# Patient Record
Sex: Female | Born: 1960 | Race: White | Hispanic: No | Marital: Married | Smoking: Never smoker
Health system: Southern US, Community
[De-identification: ages and names within clinical notes are randomized; demographics above are authoritative.]

## PROBLEM LIST (undated history)

## (undated) DIAGNOSIS — E119 Type 2 diabetes mellitus without complications: Secondary | ICD-10-CM

## (undated) DIAGNOSIS — I10 Essential (primary) hypertension: Secondary | ICD-10-CM

## (undated) DIAGNOSIS — K5792 Diverticulitis of intestine, part unspecified, without perforation or abscess without bleeding: Secondary | ICD-10-CM

## (undated) HISTORY — PX: COLON SURGERY: SHX602

## (undated) HISTORY — DX: Type 2 diabetes mellitus without complications: E11.9

---

## 1998-05-01 ENCOUNTER — Encounter: Admission: RE | Admit: 1998-05-01 | Discharge: 1998-07-30 | Payer: Self-pay | Admitting: Family Medicine

## 1999-01-15 ENCOUNTER — Other Ambulatory Visit: Admission: RE | Admit: 1999-01-15 | Discharge: 1999-01-15 | Payer: Self-pay | Admitting: Obstetrics and Gynecology

## 2000-08-09 ENCOUNTER — Other Ambulatory Visit: Admission: RE | Admit: 2000-08-09 | Discharge: 2000-08-09 | Payer: Self-pay | Admitting: Obstetrics and Gynecology

## 2000-12-26 ENCOUNTER — Encounter (INDEPENDENT_AMBULATORY_CARE_PROVIDER_SITE_OTHER): Payer: Self-pay | Admitting: Specialist

## 2000-12-26 ENCOUNTER — Ambulatory Visit (HOSPITAL_COMMUNITY): Admission: RE | Admit: 2000-12-26 | Discharge: 2000-12-26 | Payer: Self-pay | Admitting: Obstetrics and Gynecology

## 2003-03-13 ENCOUNTER — Other Ambulatory Visit: Admission: RE | Admit: 2003-03-13 | Discharge: 2003-03-13 | Payer: Self-pay | Admitting: Obstetrics and Gynecology

## 2004-05-01 ENCOUNTER — Ambulatory Visit: Payer: Self-pay | Admitting: Internal Medicine

## 2004-06-15 ENCOUNTER — Other Ambulatory Visit: Admission: RE | Admit: 2004-06-15 | Discharge: 2004-06-15 | Payer: Self-pay | Admitting: Obstetrics and Gynecology

## 2004-07-06 ENCOUNTER — Ambulatory Visit: Payer: Self-pay | Admitting: Internal Medicine

## 2004-09-16 ENCOUNTER — Ambulatory Visit: Payer: Self-pay | Admitting: Internal Medicine

## 2004-10-23 ENCOUNTER — Ambulatory Visit: Payer: Self-pay | Admitting: Internal Medicine

## 2004-11-19 ENCOUNTER — Ambulatory Visit: Payer: Self-pay | Admitting: Internal Medicine

## 2004-11-30 ENCOUNTER — Ambulatory Visit: Payer: Self-pay | Admitting: Internal Medicine

## 2005-10-15 ENCOUNTER — Ambulatory Visit: Payer: Self-pay | Admitting: Internal Medicine

## 2005-12-15 ENCOUNTER — Ambulatory Visit: Payer: Self-pay | Admitting: Internal Medicine

## 2006-05-30 ENCOUNTER — Ambulatory Visit: Payer: Self-pay | Admitting: Internal Medicine

## 2006-10-24 IMAGING — CT CT ABD-PELV W/O CM
3 of 8 series · 12 of 42 positions shown, 18 images · IV contrast (CONTRAST)
Comparison: NONE

CLINICAL DATA: Left lower quadrant pain and fever, evaluate for 
diverticulitis.  

CT OF THE ABDOMEN AND PELVIS WITHOUT AND WITH INTRAVENOUS AND 
FOLLOWING  ORAL CONTRAST
TECHNIQUE: Multiple axial 5 millimeter thick slices at 5 
millimeter intervals were obtained from the lung base through the 
pelvis following the intravenous administration of 100 cc of 
Optiray 350 at a rate of 3 cc per second.  Oral contrast was 
administered as well.  Arterial and venous phase imaging was 
obtained in the upper abdomen with delayed images obtained through 
the pelvis.

[Series 4: venous · axial · portal-venous · 0.68mm/px · z∈[+875,+1190]mm · 5 of 95 slices shown, 10 images]
[im 16/95  soft-tissue]
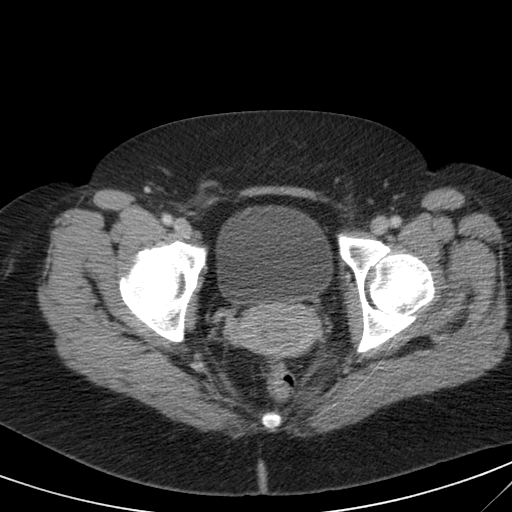
[im 16/95  bone]
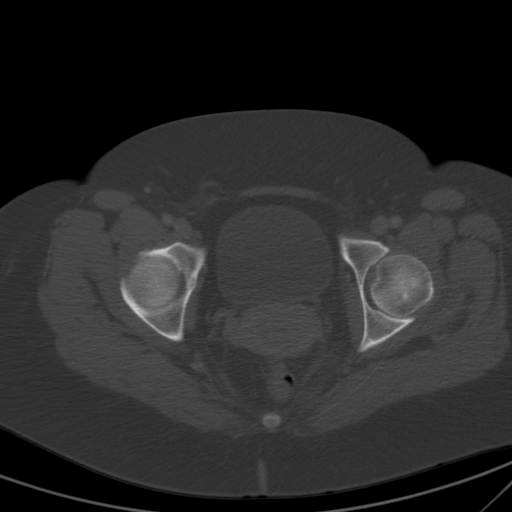
[im 32/95  soft-tissue]
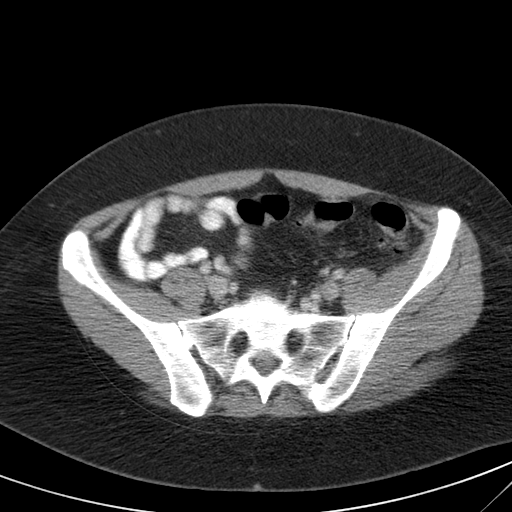
[im 32/95  lung]
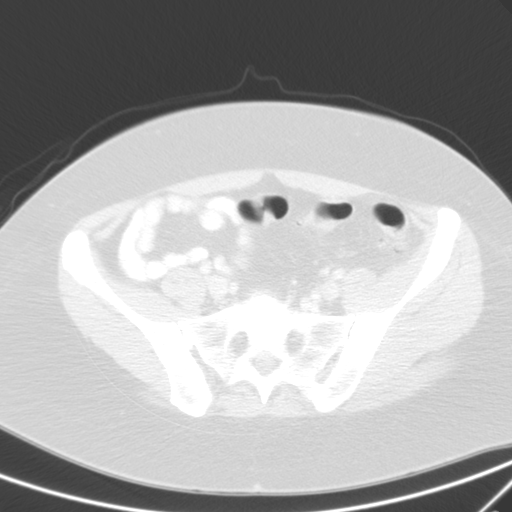
[im 48/95  soft-tissue]
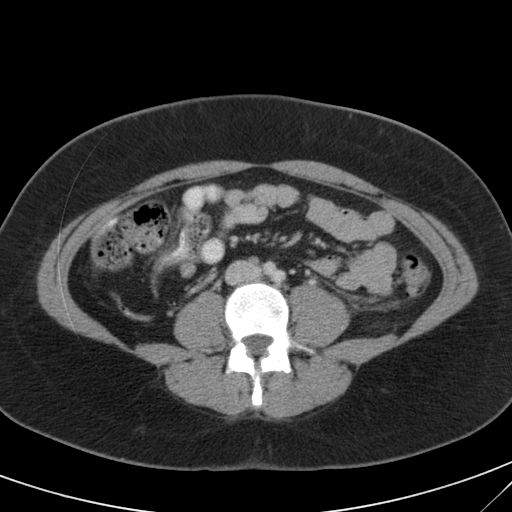
[im 48/95  lung]
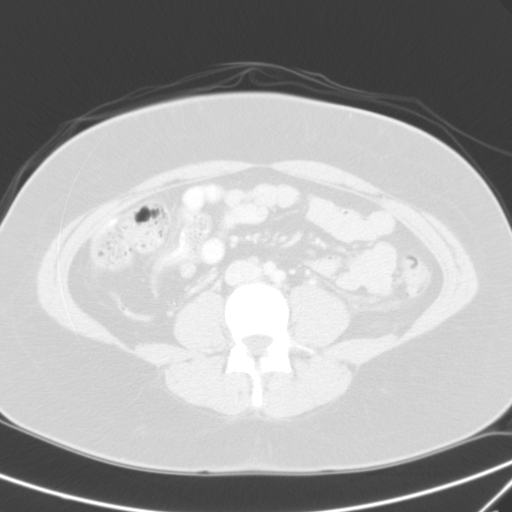
[im 63/95  soft-tissue]
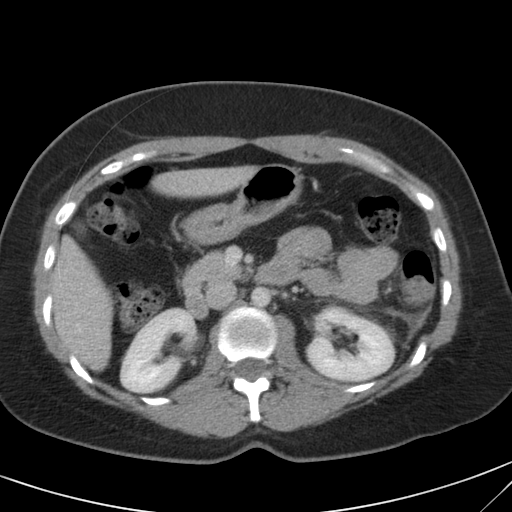
[im 63/95  lung]
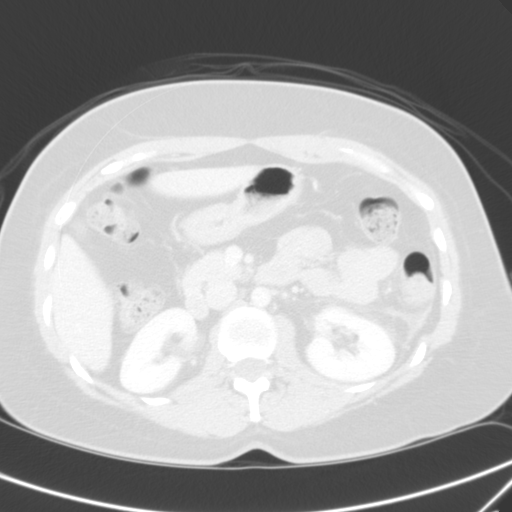
[im 79/95  soft-tissue]
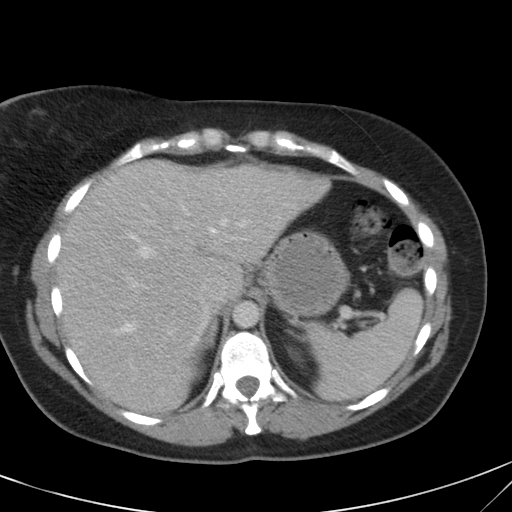
[im 79/95  lung]
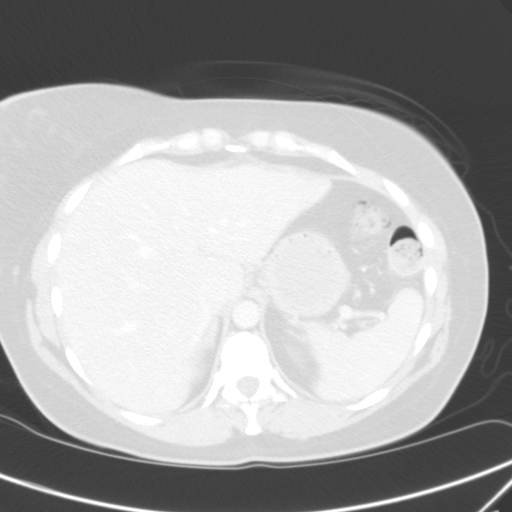

[Series 9: delays · axial · 0.68mm/px · z∈[+925,+1150]mm · 4 of 76 slices shown]
[im 16/76  soft-tissue]
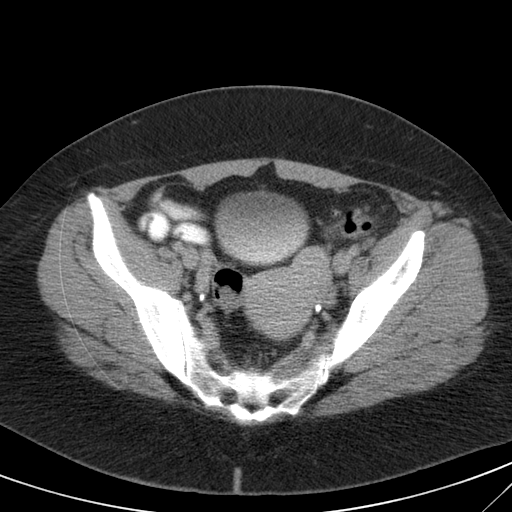
[im 31/76  soft-tissue]
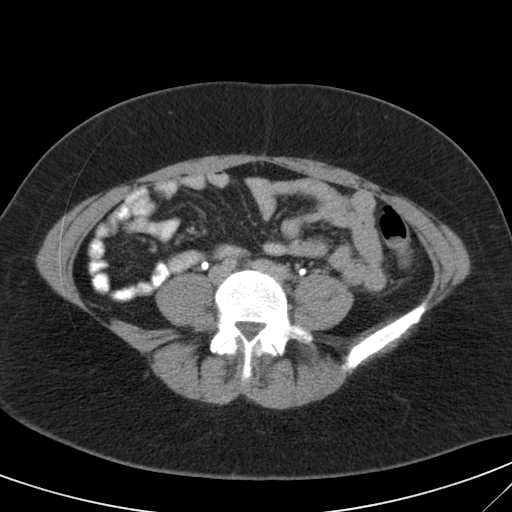
[im 46/76  soft-tissue]
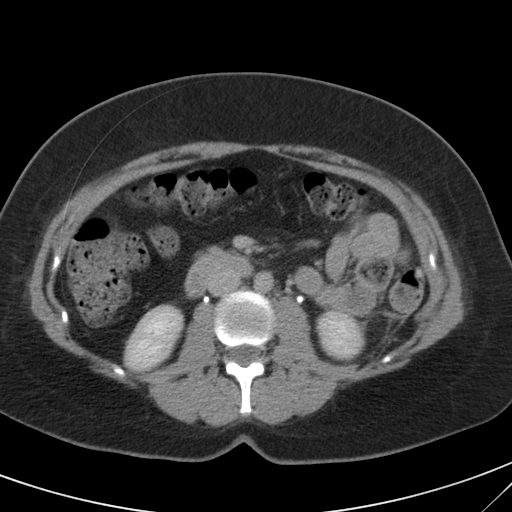
[im 61/76  soft-tissue]
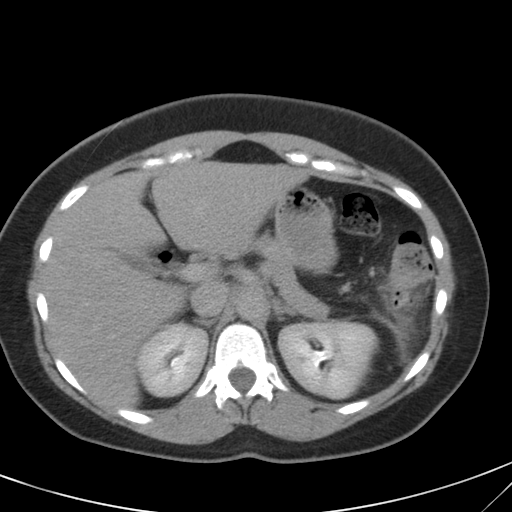

[coronals · coronal · 0.92mm/px · 3 of 68 slices shown, 4 images]
[im 23/68  soft-tissue]
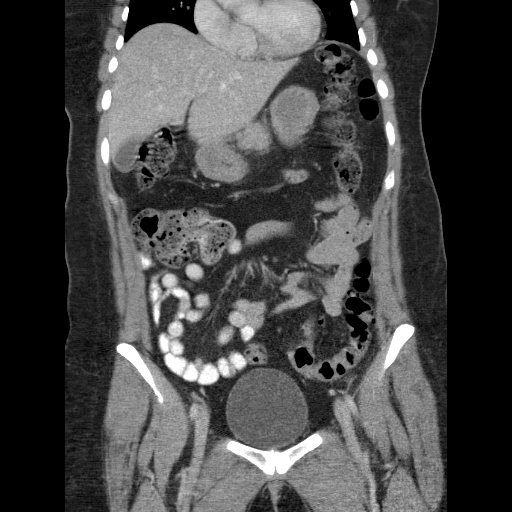
[im 30/68  soft-tissue]
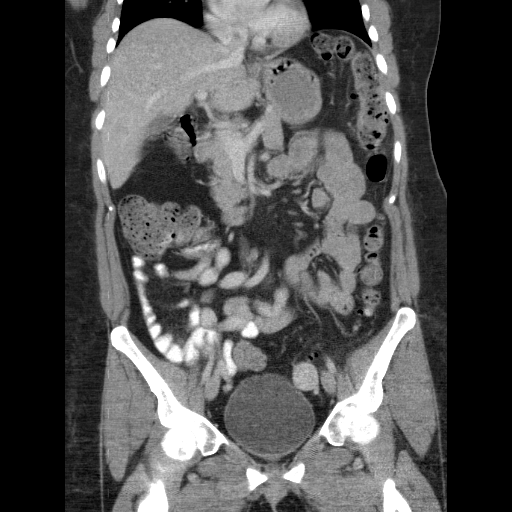
[im 30/68  bone]
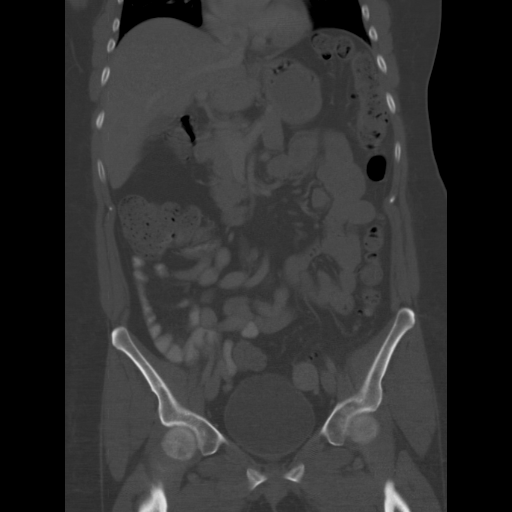
[im 38/68  soft-tissue]
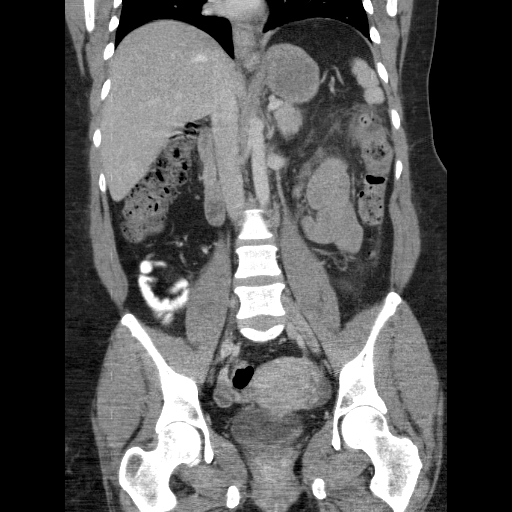

[12 of 42 positions shown; findings below may reference images not displayed]

FINDINGS: The heart size appears to be upper limits of normal.  
The visualized portion of the lung bases are within normal limits. 
 The liver, spleen, pancreas, adrenal glands, and gallbladder are 
within normal limits.  Kidneys are within normal limits.  Uterus 
and ovaries are grossly within normal limits. There is evidence of 
diverticulosis involving the sigmoid colon.  There are also 
diverticula seen in the descending colon.  There is inflammation 
adjacent to the proximal descending colon.  No evidence of abscess 
collection or free air.  There is paracolic stranding seen in this 
region.  No free fluid.  No evidence of lymphadenopathy.  No 
aortic aneurysm or anterior abdominal wall hernia.  No evidence of 
inguinal hernia.
IMPRESSION: Evidence of diverticulitis involving the proximal 
descending colon.  No evidence of abscess collection or free air.

## 2006-11-04 ENCOUNTER — Encounter: Admission: RE | Admit: 2006-11-04 | Discharge: 2006-11-04 | Payer: Self-pay | Admitting: Gastroenterology

## 2006-11-04 ENCOUNTER — Inpatient Hospital Stay (HOSPITAL_COMMUNITY): Admission: AD | Admit: 2006-11-04 | Discharge: 2006-11-07 | Payer: Self-pay | Admitting: Gastroenterology

## 2007-02-13 ENCOUNTER — Ambulatory Visit: Payer: Self-pay | Admitting: Internal Medicine

## 2007-03-24 ENCOUNTER — Encounter: Payer: Self-pay | Admitting: Internal Medicine

## 2007-04-13 ENCOUNTER — Inpatient Hospital Stay (HOSPITAL_COMMUNITY): Admission: RE | Admit: 2007-04-13 | Discharge: 2007-04-18 | Payer: Self-pay | Admitting: Surgery

## 2007-04-13 ENCOUNTER — Encounter (INDEPENDENT_AMBULATORY_CARE_PROVIDER_SITE_OTHER): Payer: Self-pay | Admitting: Surgery

## 2007-05-09 ENCOUNTER — Encounter: Payer: Self-pay | Admitting: Internal Medicine

## 2007-07-20 DIAGNOSIS — Z8719 Personal history of other diseases of the digestive system: Secondary | ICD-10-CM | POA: Insufficient documentation

## 2007-07-20 DIAGNOSIS — J310 Chronic rhinitis: Secondary | ICD-10-CM

## 2007-08-22 ENCOUNTER — Encounter: Payer: Self-pay | Admitting: Internal Medicine

## 2007-08-28 ENCOUNTER — Encounter: Admission: RE | Admit: 2007-08-28 | Discharge: 2007-08-28 | Payer: Self-pay | Admitting: Gastroenterology

## 2007-08-28 ENCOUNTER — Encounter: Payer: Self-pay | Admitting: Internal Medicine

## 2007-10-25 ENCOUNTER — Telehealth: Payer: Self-pay | Admitting: Internal Medicine

## 2008-02-21 ENCOUNTER — Ambulatory Visit: Payer: Self-pay | Admitting: Internal Medicine

## 2008-02-21 DIAGNOSIS — F411 Generalized anxiety disorder: Secondary | ICD-10-CM | POA: Insufficient documentation

## 2008-02-21 DIAGNOSIS — I10 Essential (primary) hypertension: Secondary | ICD-10-CM | POA: Insufficient documentation

## 2008-09-09 ENCOUNTER — Encounter: Payer: Self-pay | Admitting: Internal Medicine

## 2009-04-11 ENCOUNTER — Ambulatory Visit: Payer: Self-pay | Admitting: Internal Medicine

## 2010-06-19 ENCOUNTER — Ambulatory Visit
Admission: RE | Admit: 2010-06-19 | Discharge: 2010-06-19 | Payer: Self-pay | Source: Home / Self Care | Attending: Internal Medicine | Admitting: Internal Medicine

## 2010-06-19 DIAGNOSIS — K219 Gastro-esophageal reflux disease without esophagitis: Secondary | ICD-10-CM | POA: Insufficient documentation

## 2010-06-24 ENCOUNTER — Telehealth: Payer: Self-pay | Admitting: Internal Medicine

## 2010-06-25 NOTE — Assessment & Plan Note (Signed)
Summary: yearly f/u for meds//per flag/cd   Vital Signs:  Patient profile:   50 year old female Height:      64 inches Weight:      195 pounds BMI:     33.59 Temp:     98.2 degrees F oral Pulse rate:   88 / minute Pulse rhythm:   irregular Resp:     16 per minute BP sitting:   128 / 90  (left arm) Cuff size:   regular  Vitals Entered By: Lanier Prude, CMA(AAMA) (June 19, 2010 3:40 PM) CC: 1 yr f/u  Is Patient Diabetic? No Comments pt is not taking Vit D.  She needs Rf on Omeprazole   CC:  1 yr f/u .  History of Present Illness: The patient presents for a preventive health examination   Preventive Screening-Counseling & Management  Alcohol-Tobacco     Smoking Status: never  Caffeine-Diet-Exercise     Does Patient Exercise: yes  Current Medications (verified): 1)  Diovan 160 Mg  Tabs (Valsartan) .... Once Daily 2)  Tranxene-T 7.5 Mg  Tabs (Clorazepate Dipotassium) .Marland Kitchen.. 1 By Mouth Once Daily or Two Times A Day As Needed 3)  Vitamin D3 1000 Unit  Tabs (Cholecalciferol) .Marland Kitchen.. 1 By Mouth Daily 4)  Omeprazole 20 Mg Cpdr (Omeprazole) .Marland Kitchen.. 1 By Mouth Once Daily  Allergies: 1)  ! Penicillin V Potassium (Penicillin V Potassium) 2)  ! Zyrtec Allergy (Cetirizine Hcl)  Past History:  Family History: Last updated: 02/21/2008 Family History Hypertension  Past Medical History: Diverticulitis, hx of Allergic rhinitis Anxiety Hypertension GERD  Past Surgical History: 2008 Diverticulitis surgery  Social History: Married - husband has CHF 2011 Occupation: [employer]  Never Smoked Alcohol use-no Regular exercise-yes Does Patient Exercise:  yes  Review of Systems       The patient complains of weight gain.  The patient denies anorexia, fever, weight loss, vision loss, decreased hearing, hoarseness, chest pain, syncope, dyspnea on exertion, peripheral edema, prolonged cough, headaches, hemoptysis, abdominal pain, melena, hematochezia, severe indigestion/heartburn,  hematuria, incontinence, genital sores, muscle weakness, suspicious skin lesions, transient blindness, difficulty walking, depression, unusual weight change, abnormal bleeding, enlarged lymph nodes, angioedema, and breast masses.         stress  Physical Exam  General:  NAD overweight-appearing.   Head:  Normocephalic and atraumatic without obvious abnormalities. No apparent alopecia or balding. Eyes:  No corneal or conjunctival inflammation noted. EOMI. Perrla. Ears:  External ear exam shows no significant lesions or deformities.  Otoscopic examination reveals clear canals, tympanic membranes are intact bilaterally without bulging, retraction, inflammation or discharge. Hearing is grossly normal bilaterally. Nose:  External nasal examination shows no deformity or inflammation. Nasal mucosa are pink and moist without lesions or exudates. Mouth:  Oral mucosa and oropharynx without lesions or exudates.  Teeth in good repair. Neck:  No deformities, masses, or tenderness noted. Lungs:  Normal respiratory effort, chest expands symmetrically. Lungs are clear to auscultation, no crackles or wheezes. Heart:  Normal rate and regular rhythm. S1 and S2 normal without gallop, murmur, click, rub or other extra sounds. Abdomen:  Bowel sounds positive,abdomen soft and non-tender without masses, organomegaly or hernias noted. Msk:  No deformity or scoliosis noted of thoracic or lumbar spine.   Pulses:  R and L carotid,radial,femoral,dorsalis pedis and posterior tibial pulses are full and equal bilaterally Extremities:  No clubbing, cyanosis, edema, or deformity noted with normal full range of motion of all joints.   Neurologic:  No cranial nerve deficits  noted. Station and gait are normal. Plantar reflexes are down-going bilaterally. DTRs are symmetrical throughout. Sensory, motor and coordinative functions appear intact. Skin:  Intact without suspicious lesions or rashes Cervical Nodes:  No lymphadenopathy  noted Inguinal Nodes:  No significant adenopathy Psych:  Cognition and judgment appear intact. Alert and cooperative with normal attention span and concentration. No apparent delusions, illusions, hallucinations   Impression & Recommendations:  Problem # 1:  HEALTH MAINTENANCE EXAM (ICD-V70.0) Assessment New Health and age related issues were discussed. Available screening tests and vaccinations were discussed as well. Healthy life style including good diet and exercise was discussed.  Labs ordered Shots are up to date  Problem # 2:  HYPERTENSION (ICD-401.9) Assessment: Improved  Her updated medication list for this problem includes:    Diovan 160 Mg Tabs (Valsartan) ..... Once daily  BP today: 128/90 Prior BP: 150/92 (04/11/2009)  Problem # 3:  ANXIETY (ICD-300.00) Assessment: Unchanged  Her updated medication list for this problem includes:    Tranxene-t 7.5 Mg Tabs (Clorazepate dipotassium) .Marland Kitchen... 1 by mouth once daily or two times a day as needed  Problem # 4:  GERD (ICD-530.81) Assessment: Unchanged  Her updated medication list for this problem includes:    Omeprazole 20 Mg Cpdr (Omeprazole) .Marland Kitchen... 1 by mouth once daily  Complete Medication List: 1)  Diovan 160 Mg Tabs (Valsartan) .... Once daily 2)  Tranxene-t 7.5 Mg Tabs (Clorazepate dipotassium) .Marland Kitchen.. 1 by mouth once daily or two times a day as needed 3)  Vitamin D3 1000 Unit Tabs (Cholecalciferol) .Marland Kitchen.. 1 by mouth daily 4)  Omeprazole 20 Mg Cpdr (Omeprazole) .Marland Kitchen.. 1 by mouth once daily  Patient Instructions: 1)  Labs next month: 2)  BMP prior to visit, ICD-9: 3)  Hepatic Panel prior to visit, ICD-9:v70.0 4)  Lipid Panel prior to visit, ICD-9: 5)  TSH prior to visit, ICD-9: 6)  CBC w/ Diff prior to visit, ICD-9: 7)  Urine-dip prior to visit, ICD-9: 8)  Please schedule a follow-up appointment in 1 year well w/labs. Prescriptions: TRANXENE-T 7.5 MG  TABS (CLORAZEPATE DIPOTASSIUM) 1 by mouth once daily or two times a  day as needed  #60 x 3   Entered and Authorized by:   Tresa Garter MD   Signed by:   Tresa Garter MD on 06/19/2010   Method used:   Print then Give to Patient   RxID:   [id number] OMEPRAZOLE 20 MG CPDR (OMEPRAZOLE) 1 by mouth once daily  #30 x 11   Entered and Authorized by:   Tresa Garter MD   Signed by:   Tresa Garter MD on 06/19/2010   Method used:   Print then Give to Patient   RxID:   [id number] DIOVAN 160 MG  TABS (VALSARTAN) once daily  #30 Tablet x 11   Entered and Authorized by:   Tresa Garter MD   Signed by:   Tresa Garter MD on 06/19/2010   Method used:   Print then Give to Patient   RxID:   [id number]    Orders Added: 1)  Est. Patient 40-64 years [53664]   Immunization History:  Pneumovax Immunization History:    Pneumovax:  historical (04/07/2006)  Tetanus/Td Immunization History:    Tetanus/Td:  historical (04/07/2006)   Immunization History:  Pneumovax Immunization History:    Pneumovax:  Historical (04/07/2006)  Tetanus/Td Immunization History:    Tetanus/Td:  Historical (04/07/2006)

## 2010-07-01 NOTE — Progress Notes (Signed)
Summary: PA-Omeprazole  Phone Note From Pharmacy   Summary of Call: PA-Omeprazole called [facility] @ [phone], per [facility] omeprazole 20mg  is not covered. Initial call taken by: Dagoberto Reef,  June 24, 2010 3:00 PM  Follow-up for Phone Call        Change to 40 mg pls Follow-up by: Tresa Garter MD,  June 24, 2010 5:15 PM    New/Updated Medications: OMEPRAZOLE 40 MG CPDR (OMEPRAZOLE) 1 once daily Prescriptions: OMEPRAZOLE 40 MG CPDR (OMEPRAZOLE) 1 once daily  #90 x 3   Entered by:   Lamar Sprinkles, CMA   Authorized by:   Tresa Garter MD   Signed by:   Lamar Sprinkles, CMA on 06/24/2010   Method used:   Electronically to        [facility]* (retail)             ,          Ph: [phone]       Fax: [phone]   RxID:   [id number]

## 2010-07-23 ENCOUNTER — Other Ambulatory Visit: Payer: Self-pay

## 2010-10-06 NOTE — H&P (Signed)
NAME:  Elizabeth Ware, Elizabeth Ware                 ACCOUNT NO.:  [id number]   MEDICAL RECORD NO.:  [id number]          PATIENT TYPE:  INP   LOCATION:  5738                         FACILITY:  [facility]   PHYSICIAN:  Petra Kuba, M.D.    DATE OF BIRTH:  1961-05-10   DATE OF ADMISSION:  11/04/2006  DATE OF DISCHARGE:                              HISTORY & PHYSICAL   HISTORY:  The patient admitted over the phone after a repeat CT by Dr.  Randa Evens showed persistent diverticulitis.  She has been on oral  antibiotics for 11 days and not getting better with increasing white  count and fever.  She continues to have left lower quadrant pain, worse  when she takes a deep breath and some minimal nausea, but no vomiting.  She has been moving her bowels without any blood.  She has had multiple  bouts of diverticulitis over the years and was put on antibiotics  multiple times; however, this was the first hospitalization.  She has no  other significant medical problems or complaints.  She did have BEE in  2000 and 2 recent CTs one here locally and at [facility].  Results are  on the chart.  No other findings other an her diverticulitis in the  descending colon splenic flexure.   PAST MEDICAL HISTORY:  Pertinent for:  1. High blood pressure.  2. Diverticulitis recurring as above.  3. Some GYN problems followed by Dr. Dareen Piano with history of      miscarriages, D&Cs, and an ablation procedure.   FAMILY HISTORY:  Pertinent for a mom with quiescent ulcerative colitis  as well as some diverticula in the family.  No colon cancer, colon  polyps, Crohn's disease.  Ulcers also run in the family.   SOCIAL HISTORY:  Does not smoke or drink, but has been using Advil for  the pain.   ALLERGIES:  PENICILLIN.   CURRENT MEDICATIONS:  Include:  1. Diovan.  2. Cipro.  3. Flagyl.  4. She has used Darvocet before without any problems.   REVIEW OF SYSTEMS:  Negative for above.  Specifically, no urinary  problems.   PHYSICAL EXAMINATION:  GENERAL:  In no acute distress.  VITAL SIGNS:  See chart.  LUNGS:  Clear.  HEART:  Regular rate and rhythm.  ABDOMEN:  Soft, nontender.   LABORATORY DATA:  Pending at time of dictation.  Repeat CT reviewed on  chart.   ASSESSMENT:  1. Recurrent and failed outpatient diverticulitis.  2. High blood pressure.   PLAN:  Bowel rest and clear liquids, IV antibiotics, consider surgical  consult now versus settling her down proceeding with an outpatient  colonoscopy and barium enema for roughly 2 weeks and then surgical  consult per elective left hemicolectomy based on her multiple bouts.  We  talked about that plan with her and her husband and they are in  agreement.  In the meantime, continue with Darvocet 10 mg if needed,  Phenergan and Ambien.  We will try to ambulate her per her routine.  Hopefully this will not be a prolonged hospitalization and we will  continue her home Diovan.  Consult Dr. Posey Rea with any medical issues  that come up.           ______________________________  Petra Kuba, M.D.     MEM/MEDQ  D:  11/04/2006  T:  11/05/2006  Job:  [id number]   cc:   Georgina Quint. Plotnikov, MD

## 2010-10-06 NOTE — Discharge Summary (Signed)
NAME:  JERZEY, KOMPERDA NO.:  [id number]   MEDICAL RECORD NO.:  [id number]          PATIENT TYPE:  INP   LOCATION:  5738                         FACILITY:  [facility]   PHYSICIAN:  Graylin Shiver, M.D.   DATE OF BIRTH:  Dec 24, 1960   DATE OF ADMISSION:  11/04/2006  DATE OF DISCHARGE:  11/07/2006                               DISCHARGE SUMMARY   REASON FOR ADMISSION:  The patient is a 50 year old female who was  admitted to the hospital because of diverticulitis with persistent  symptoms as an outpatient despite oral antibiotics for 11 days.  She was  admitted on November 04, 2006, with these ongoing symptoms.   PHYSICAL EXAMINATION:  HEART: Regular rhythm.  LUNGS: Clear.  ABDOMEN:  Soft and nontender.   HOSPITAL COURSE:  The patient was admitted to the hospital, started on  IV fluids, IV Cipro 400 mg q.8 h, IV Flagyl 500 mg q.8 h . By the  following day, she was feeling better.  On the day of discharge, she has  no complaints of abdominal pain.  She has tolerated a soft diet.  Her  exam revealed normal bowel sounds on the abdomen, and the abdomen was  soft and nontender.   IMPRESSION:  Diverticulitis.   DISCHARGE MEDICATIONS:  1. Cipro 500 mg p.o. q.12 h.  2. Flagyl 500 mg p.o. q.12 h.   DISCHARGE INSTRUCTIONS:  1. Diet:  Soft, low residue.  2 . Activity:  As tolerated.   FOLLOWUP:  Follow-up with Dr. Leary Roca on November 11, 2006, at 2:30 p.m.  in the office           ______________________________  Graylin Shiver, M.D.     SFG/MEDQ  D:  11/07/2006  T:  11/07/2006  Job:  [id number]   cc:   Petra Kuba, M.D.

## 2010-10-06 NOTE — Op Note (Signed)
Elizabeth Ware, Elizabeth Ware                 ACCOUNT NO.:  [id number]   MEDICAL RECORD NO.:  [id number]          PATIENT TYPE:  INP   LOCATION:  5705                         FACILITY:  [facility]   PHYSICIAN:  Velora Heckler, MD      DATE OF BIRTH:  Oct 20, 1960   DATE OF PROCEDURE:  04/13/2007  DATE OF DISCHARGE:                               OPERATIVE REPORT   PREOPERATIVE DIAGNOSIS:  Chronic diverticular disease.   POSTOPERATIVE DIAGNOSIS:  Chronic diverticular disease.   PROCEDURE:  Distal descending and proximal sigmoid colectomy.   SURGEON:  Velora Heckler, M.D., FACS   ASSISTANT:  Sheppard Plumber. Earlene Plater, M.D., FACS   ANESTHESIA:  General per Dr. Arta Bruce.   ESTIMATED BLOOD LOSS:  Minimal.   PREPARATION:  Betadine.   COMPLICATIONS:  None.   INDICATIONS:  The patient is a 50 year old white female from [place],  [place].  She has had intermittent episodes of diverticulitis  dating back to 67.  These episodes have generally been treated with  oral antibiotics and bowel rest.  Colonoscopy by Dr. Vida Rigger  demonstrated large mouth diverticula of the sigmoid colon and descending  colon.  The patient now comes to surgery for resection.   BODY OF REPORT:  The procedure was done in OR number 17 at [facility].  The patient was brought to the operating room and placed in  the supine position on the operating room table.  Following the  administration of general anesthesia, the patient is prepped and draped  in the usual strict aseptic fashion.  After ascertaining that an  adequate level of anesthesia had been obtained, a midline abdominal  incision was made with a #10 blade.  Dissection was carried down through  subcutaneous tissues and hemostasis obtained with electrocautery.  The  fascia was incised in the midline with electrocautery and the peritoneal  cavity is entered cautiously.  A Balfour retractor was placed for  exposure.  The abdomen was explored.  Beginning in the  pelvis, the  distal sigmoid colon was grossly normal.  There are no significant  diverticula until the mid portion of the sigmoid colon.  In the proximal  sigmoid colon. however, there is a dense inflammatory mass measuring  about 8 cm in length representing the focal area of diverticular  disease.  Proximal to this mass in the lower descending colon are  scattered diverticula.  The proximal descending colon, transverse colon,  and ascending colon were all grossly normal.   A decision was then made to proceed with resection of the distal  descending colon and proximal sigmoid colon as this was the area with  chronic inflammatory changes and the bulk of the patient's of visible  diverticula.  The bowel was mobilized from its lateral peritoneal  attachments along the white line with the electrocautery.  The mid  sigmoid colon is transected with a GIA stapler just below the last  remaining diverticulum.  The mesentery is divided with the LigaSure.  Dissection was carried proximally.  The bowel was transected with a GIA  stapler in the  mid descending colon just above the last visible  diverticulum.  This was transected with a GIA stapler.  The mesentery is  again taken down using the LigaSure.  At the level of the inferior  mesenteric artery, the vessels are clamped with Kelly clamps and then  divided with the LigaSure.  The vessels were then tied securely with 2-0  silk ties.  The specimen is passed off the field.  Sutures were used to  mark the distal margin.  The specimen is submitted to pathology for  permanent review.  The abdomen was irrigated with warm saline which was  evacuated.  The bowel was then reanastomosed with a primary end-to-end  anastomosis.  This was completed by excising the previous staple lines.  The bowel appears healthy.  No further diverticula are identified.  An  end-to-end anastomosis is created with a single layer of interrupted 3-0  silk sutures.  No tension is  present on the anastomosis.  The mesenteric  defect is closed with interrupted 2-0 silk sutures.   Instruments and gloves were changed.  The abdomen was irrigated  copiously with warm saline.  Packs are removed and retractors were  removed from the abdominal cavity.  The midline wound is closed with  interrupted #1 Novofil figure-of-eight sutures.  The subcutaneous  tissues were irrigated.  The skin was closed with stainless steel  staples.  Sterile dressings were applied.  The patient is awakened from  anesthesia and brought to the recovery room in stable condition.  The  patient tolerated the procedure well.      Velora Heckler, MD  Electronically Signed     TMG/MEDQ  D:  04/13/2007  T:  04/13/2007  Job:  [id number]   cc:   Petra Kuba, M.D.  Georgina Quint. Plotnikov, MD  Malva Limes, M.D.

## 2010-10-09 NOTE — Op Note (Signed)
[facility]  Patient:    Elizabeth Ware, Elizabeth Ware                        MRN: [id number] Proc. Date: 12/26/00 Adm. Date:  [id number] Attending:  Osborn Coho                           Operative Report  PREOPERATIVE DIAGNOSES:       1. Menorrhagia.                               2. Dysmenorrhea.  POSTOPERATIVE DIAGNOSES:      1. Menorrhagia.                               2. Dysmenorrhea.  PROCEDURES:                   1. Dilation and curettage.                               2. Hysteroscopy.                               3. Endometrial ablation with roller ball.  SURGEON:                      Mark E. Dareen Piano, M.D.  ANESTHESIA:                   General.  ANTIBIOTICS:                  Ancef 1 g.  ESTIMATED BLOOD LOSS:         Minimal.  COMPLICATIONS:                None.  SPECIMENS:                    Endometrial curettings sent to pathology.  DESCRIPTION OF PROCEDURE:     The patient was taken to the operating room, where she was placed in the dorsal supine position.  General anesthetic was administered without complications.  She was then placed in the dorsal lithotomy position and prepped with Hibiclens.  She was draped in the usual fashion for this procedure.  A red rubber catheter was placed to her bladder. A sterile speculum was placed in the vagina.  A single-tooth tenaculum was applied to the anterior cervical lip.  The cervical os was then dilated to a #33 [place].  The hysteroscope was then advanced into the endocervical canal, which appeared to be normal.  On entering the endometrial canal, the tissue appeared thickened.  However, there was no evidence of any endometrial polyps or uterine fibroids.  Both ostia were visualized.  At this point, the hysteroscope was removed.  Sharp curettage was then performed.  Next, the resectoscope was set up with the VaporTrode on 70 watts.  The entire endometrial cavity was aggressively ablated.   Estimated fluid deficit was 200 cc.  The patient tolerated the procedure well.  She was taken to the recovery room in stable condition.  Instrument and laparotomy counts were correct x 1.  The patient was sent home with Anaprox double  strength to take p.r.n.  She will follow up in the office in four weeks. DD:  12/26/00 TD:  12/27/00 Job: [id number]

## 2010-10-09 NOTE — Discharge Summary (Signed)
Elizabeth Ware, Elizabeth Ware                 ACCOUNT NO.:  [id number]   MEDICAL RECORD NO.:  [id number]          PATIENT TYPE:  INP   LOCATION:  5712                         FACILITY:  [facility]   PHYSICIAN:  Velora Heckler, MD      DATE OF BIRTH:  01-19-1961   DATE OF ADMISSION:  04/13/2007  DATE OF DISCHARGE:  04/18/2007                               DISCHARGE SUMMARY   REASON FOR ADMISSION:  Diverticular disease.   HISTORY OF PRESENT ILLNESS:  The patient is a 50 year old white female  from [place], [place].  The patient was referred by Dr. Vida Rigger for intermittent episodes of diverticulitis dating back to 1997.  These have been treated with oral antibiotics and bowel rest for the  most part.  However, in June 2008 she did require hospitalization and  administration of intravenous antibiotics.  The patient has undergone  colonoscopy by Dr. Vida Rigger showing small diverticula of the sigmoid  colon and descending colon.  The patient now comes to surgery for  elective resection.   HOSPITAL COURSE:  The patient was admitted on April 13, 2007, after  home preparation.  She underwent exploratory laparotomy with distal  descending and proximal sigmoid colectomy.  She had primary anastomosis.  Postoperative course was straightforward.  The patient had gradual  resolution of her ileus.  She was initially started on a clear liquid  diet on the second postoperative day.  She advanced to full liquid diet  on the third postoperative day.  She was able tolerate a regular diet on  the fourth postoperative day and was prepared for discharge home on the  fifth postoperative day.   DISCHARGE PLAN:  The patient was discharged home on April 18, 2007,  in good condition, tolerating a regular diet and ambulating  independently.  The patient will return to see me in my office at  [facility] surgery in 2-3 weeks.  Discharge medications include  Vicodin as needed for pain.   FINAL  DIAGNOSIS:  Diverticular disease of the sigmoid colon.   CONDITION AT DISCHARGE:  Improved.      Velora Heckler, MD  Electronically Signed     TMG/MEDQ  D:  06/05/2007  T:  06/05/2007  Job:  [id number]   cc:   Petra Kuba, M.D.  Georgina Quint. Plotnikov, MD

## 2010-12-29 ENCOUNTER — Other Ambulatory Visit: Payer: Self-pay | Admitting: Obstetrics and Gynecology

## 2011-03-02 LAB — CBC
HCT: 36.7
Hemoglobin: 12.6
MCHC: 33.9
MCHC: 34.5
Platelets: 293
Platelets: 346
RBC: 4.33
RDW: 12.3
WBC: 9

## 2011-03-02 LAB — DIFFERENTIAL
Basophils Relative: 1
Eosinophils Absolute: 0.1 — ABNORMAL LOW
Lymphocytes Relative: 25
Monocytes Relative: 7
Neutro Abs: 6
Neutrophils Relative %: 66

## 2011-03-02 LAB — COMPREHENSIVE METABOLIC PANEL
ALT: 34
Alkaline Phosphatase: 51
Calcium: 9.2
GFR calc non Af Amer: >60
Glucose, Bld: 111 — ABNORMAL HIGH

## 2011-03-02 LAB — PROTIME-INR: INR: 0.9

## 2011-03-02 LAB — URINALYSIS, ROUTINE W REFLEX MICROSCOPIC
Bilirubin Urine: NEGATIVE
Hgb urine dipstick: NEGATIVE
Nitrite: NEGATIVE
Protein, ur: NEGATIVE

## 2011-03-02 LAB — BASIC METABOLIC PANEL
BUN: 2 — ABNORMAL LOW
CO2: 29
CO2: 31
Calcium: 8.8
Creatinine, Ser: 0.8
GFR calc Af Amer: >60
Glucose, Bld: 128 — ABNORMAL HIGH
Potassium: 3.2 — ABNORMAL LOW
Sodium: 136
Sodium: 139

## 2011-03-02 LAB — TYPE AND SCREEN
ABO/RH(D): A POS
Antibody Screen: NEGATIVE

## 2011-03-02 LAB — URINE MICROSCOPIC-ADD ON

## 2011-03-10 LAB — CBC
HCT: 33 — ABNORMAL LOW
Hemoglobin: 11 — ABNORMAL LOW
Hemoglobin: 11.3 — ABNORMAL LOW
MCV: 85.4
MCV: 85.5
RBC: 3.86 — ABNORMAL LOW
RDW: 12.4
WBC: 8

## 2011-03-10 LAB — BASIC METABOLIC PANEL
BUN: 3 — ABNORMAL LOW
BUN: 3 — ABNORMAL LOW
CO2: 25
CO2: 28
Calcium: 8.5
Calcium: 8.5
Creatinine, Ser: 0.71
Creatinine, Ser: 0.72
Glucose, Bld: 106 — ABNORMAL HIGH
Glucose, Bld: 110 — ABNORMAL HIGH
Potassium: 3.6

## 2011-03-11 LAB — COMPREHENSIVE METABOLIC PANEL
ALT: 22
AST: 18
Albumin: 3.6
Alkaline Phosphatase: 62
BUN: 6
Chloride: 102
Potassium: 4
Sodium: 134 — ABNORMAL LOW
Total Bilirubin: 0.4

## 2011-03-11 LAB — CBC
Hemoglobin: 12.5
Platelets: 522 — ABNORMAL HIGH
RBC: 4.34

## 2011-06-21 ENCOUNTER — Other Ambulatory Visit: Payer: Self-pay | Admitting: *Deleted

## 2011-06-21 MED ORDER — VALSARTAN 160 MG PO TABS: 160.0000 mg | ORAL_TABLET | Freq: Every day | ORAL | Status: DC

## 2011-07-28 ENCOUNTER — Telehealth: Payer: Self-pay | Admitting: Internal Medicine

## 2011-07-28 NOTE — Telephone Encounter (Signed)
Per spouse need 3 mth of Diovan call in---[facility] [address]New pharmacy---Spouse [phone]

## 2011-07-29 MED ORDER — VALSARTAN 160 MG PO TABS: 160.0000 mg | ORAL_TABLET | Freq: Every day | ORAL | Status: DC

## 2011-07-29 NOTE — Telephone Encounter (Signed)
Done. Will have schedulers contact pt to schedule OV.

## 2011-09-17 ENCOUNTER — Ambulatory Visit (INDEPENDENT_AMBULATORY_CARE_PROVIDER_SITE_OTHER): Payer: Managed Care, Other (non HMO) | Admitting: Internal Medicine

## 2011-09-17 ENCOUNTER — Encounter: Payer: Self-pay | Admitting: Internal Medicine

## 2011-09-17 VITALS — BP 140/100 | HR 84 | Temp 98.5°F | Resp 16 | Wt 194.0 lb

## 2011-09-17 DIAGNOSIS — I10 Essential (primary) hypertension: Secondary | ICD-10-CM

## 2011-09-17 DIAGNOSIS — H04209 Unspecified epiphora, unspecified lacrimal gland: Secondary | ICD-10-CM

## 2011-09-17 DIAGNOSIS — J310 Chronic rhinitis: Secondary | ICD-10-CM

## 2011-09-17 MED ORDER — OLOPATADINE HCL 0.1 % OP SOLN: 1.0000 [drp] | Freq: Two times a day (BID) | OPHTHALMIC | Status: DC

## 2011-09-17 MED ORDER — VALSARTAN 160 MG PO TABS: 160.0000 mg | ORAL_TABLET | Freq: Every day | ORAL | Status: DC

## 2011-09-17 MED ORDER — ERYTHROMYCIN 5 MG/GM OP OINT: TOPICAL_OINTMENT | Freq: Every day | OPHTHALMIC | Status: AC

## 2011-09-17 NOTE — Progress Notes (Signed)
  Subjective:    Patient ID: Elizabeth Ware, female    DOB: [birth year], 51 y.o.   MRN: [id number]  HPI  C/o R eye tearing x wks; no pain; occ sticky in am between the eyelids. No pain or blurred vision F/u allergies, HTN  Wt Readings from Last 3 Encounters:  09/17/11 194 lb (87.998 kg)  06/19/10 195 lb (88.451 kg)  04/11/09 197 lb (89.359 kg)   BP Readings from Last 3 Encounters:  09/17/11 140/100  06/19/10 128/90  04/11/09 150/92      Review of Systems  Constitutional: Negative for fever, chills, activity change, appetite change, fatigue and unexpected weight change.  HENT: Positive for rhinorrhea. Negative for congestion, facial swelling, mouth sores, voice change and sinus pressure.   Eyes: Positive for discharge. Negative for photophobia, pain, redness, itching and visual disturbance.  Respiratory: Negative for cough, chest tightness and shortness of breath.   Gastrointestinal: Negative for nausea and abdominal pain.  Genitourinary: Negative for frequency, difficulty urinating and vaginal pain.  Musculoskeletal: Negative for back pain and gait problem.  Skin: Negative for pallor and rash.  Neurological: Negative for dizziness, tremors, weakness, numbness and headaches.  Psychiatric/Behavioral: Negative for confusion and sleep disturbance.       Objective:   Physical Exam  Constitutional: She appears well-developed. No distress.  HENT:  Head: Normocephalic.  Right Ear: External ear normal.  Left Ear: External ear normal.  Nose: Nose normal.  Mouth/Throat: Oropharynx is clear and moist.  Eyes: Conjunctivae are normal. Pupils are equal, round, and reactive to light. Right eye exhibits no discharge. Left eye exhibits no discharge.  Neck: Normal range of motion. Neck supple. No JVD present. No tracheal deviation present. No thyromegaly present.  Cardiovascular: Normal rate, regular rhythm and normal heart sounds.   Pulmonary/Chest: No stridor. No respiratory distress. She  has no wheezes.  Abdominal: Soft. Bowel sounds are normal. She exhibits no distension and no mass. There is no tenderness. There is no rebound and no guarding.  Musculoskeletal: She exhibits no edema and no tenderness.  Lymphadenopathy:    She has no cervical adenopathy.  Neurological: She displays normal reflexes. No cranial nerve deficit. She exhibits normal muscle tone. Coordination normal.  Skin: No rash noted. No erythema.  Psychiatric: She has a normal mood and affect. Her behavior is normal. Judgment and thought content normal.          Assessment & Plan:

## 2011-09-19 ENCOUNTER — Encounter: Payer: Self-pay | Admitting: Internal Medicine

## 2011-09-19 DIAGNOSIS — H04209 Unspecified epiphora, unspecified lacrimal gland: Secondary | ICD-10-CM | POA: Insufficient documentation

## 2011-09-19 NOTE — Assessment & Plan Note (Signed)
Continue with current prescription therapy as reflected on the Med list.  

## 2011-09-19 NOTE — Assessment & Plan Note (Signed)
Claritin prn 

## 2011-09-19 NOTE — Assessment & Plan Note (Signed)
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

## 2011-10-18 ENCOUNTER — Other Ambulatory Visit: Payer: Self-pay | Admitting: Internal Medicine

## 2012-01-26 ENCOUNTER — Ambulatory Visit (INDEPENDENT_AMBULATORY_CARE_PROVIDER_SITE_OTHER): Payer: Managed Care, Other (non HMO) | Admitting: Internal Medicine

## 2012-01-26 ENCOUNTER — Encounter: Payer: Self-pay | Admitting: Internal Medicine

## 2012-01-26 VITALS — BP 128/90 | HR 80 | Temp 98.8°F | Resp 16 | Wt 199.0 lb

## 2012-01-26 DIAGNOSIS — J45901 Unspecified asthma with (acute) exacerbation: Secondary | ICD-10-CM | POA: Insufficient documentation

## 2012-01-26 DIAGNOSIS — J310 Chronic rhinitis: Secondary | ICD-10-CM

## 2012-01-26 DIAGNOSIS — J069 Acute upper respiratory infection, unspecified: Secondary | ICD-10-CM

## 2012-01-26 MED ORDER — METHYLPREDNISOLONE ACETATE 80 MG/ML IJ SUSP
120.0000 mg | Freq: Once | INTRAMUSCULAR | Status: AC
  Administered 2012-01-26: 120 mg via INTRAMUSCULAR

## 2012-01-26 MED ORDER — AZITHROMYCIN 250 MG PO TABS: ORAL_TABLET | ORAL | Status: AC

## 2012-01-26 MED ORDER — LORATADINE 10 MG PO TABS: 10.0000 mg | ORAL_TABLET | Freq: Every day | ORAL | Status: DC

## 2012-01-26 MED ORDER — BECLOMETHASONE DIPROPIONATE 80 MCG/ACT NA AERS: 1.0000 | INHALATION_SPRAY | Freq: Every day | NASAL | Status: DC

## 2012-01-26 MED ORDER — MOMETASONE FURO-FORMOTEROL FUM 200-5 MCG/ACT IN AERO: 1.0000 | INHALATION_SPRAY | Freq: Two times a day (BID) | RESPIRATORY_TRACT | Status: DC

## 2012-01-26 MED ORDER — VITAMIN D 1000 UNITS PO TABS: 1000.0000 [IU] | ORAL_TABLET | Freq: Every day | ORAL | Status: AC

## 2012-01-26 MED ORDER — OLOPATADINE HCL 0.1 % OP SOLN: 1.0000 [drp] | Freq: Two times a day (BID) | OPHTHALMIC | Status: AC

## 2012-01-26 NOTE — Assessment & Plan Note (Signed)
Loratidine qd Qnasal Depomedrol 120 mg im

## 2012-01-26 NOTE — Assessment & Plan Note (Signed)
Dulera bid 

## 2012-01-26 NOTE — Assessment & Plan Note (Signed)
Zpac 

## 2012-01-26 NOTE — Progress Notes (Signed)
Patient ID: Elizabeth Ware, female   DOB: 17-Oct-1960, 51 y.o.   MRN: [id number]  Subjective:    Patient ID: Elizabeth Ware, female    DOB: 01/03/1961, 51 y.o.   MRN: [id number]  Shortness of Breath This is a recurrent problem. The current episode started 1 to 4 weeks ago. The problem occurs constantly. The problem has been waxing and waning. Associated symptoms include rhinorrhea and wheezing. Pertinent negatives include no abdominal pain, chest pain, fever, headaches, rash or sputum production. The treatment provided mild relief. Her past medical history is significant for allergies. There is no history of asthma.    C/o R eye tearing x wks; no pain; occ sticky in am between the eyelids. No pain or blurred vision F/u allergies, HTN  Wt Readings from Last 3 Encounters:  01/26/12 199 lb (90.266 kg)  09/17/11 194 lb (87.998 kg)  06/19/10 195 lb (88.451 kg)   BP Readings from Last 3 Encounters:  01/26/12 128/90  09/17/11 140/100  06/19/10 128/90      Review of Systems  Constitutional: Negative for fever, chills, activity change, appetite change, fatigue and unexpected weight change.  HENT: Positive for rhinorrhea. Negative for congestion, facial swelling, mouth sores, voice change and sinus pressure.   Eyes: Positive for discharge. Negative for photophobia, pain, redness, itching and visual disturbance.  Respiratory: Positive for shortness of breath and wheezing. Negative for cough, sputum production and chest tightness.   Cardiovascular: Negative for chest pain.  Gastrointestinal: Negative for nausea and abdominal pain.  Genitourinary: Negative for frequency, difficulty urinating and vaginal pain.  Musculoskeletal: Negative for back pain and gait problem.  Skin: Negative for pallor and rash.  Neurological: Negative for dizziness, tremors, weakness, numbness and headaches.  Psychiatric/Behavioral: Negative for confusion and disturbed wake/sleep cycle.       Objective:   Physical Exam   Constitutional: She appears well-developed. No distress.       Obese  HENT:  Head: Normocephalic.  Right Ear: External ear normal.  Left Ear: External ear normal.  Nose: Nose normal.  Mouth/Throat: Oropharynx is clear and moist.       Swollen nasal mucosa  Eyes: Conjunctivae are normal. Pupils are equal, round, and reactive to light. Right eye exhibits no discharge. Left eye exhibits no discharge.  Neck: Normal range of motion. Neck supple. No JVD present. No tracheal deviation present. No thyromegaly present.  Cardiovascular: Normal rate, regular rhythm and normal heart sounds.   Pulmonary/Chest: No stridor. No respiratory distress. She has no wheezes.  Abdominal: Soft. Bowel sounds are normal. She exhibits no distension and no mass. There is no tenderness. There is no rebound and no guarding.  Musculoskeletal: She exhibits no edema and no tenderness.  Lymphadenopathy:    She has no cervical adenopathy.  Neurological: She displays normal reflexes. No cranial nerve deficit. She exhibits normal muscle tone. Coordination normal.  Skin: No rash noted. No erythema.  Psychiatric: She has a normal mood and affect. Her behavior is normal. Judgment and thought content normal.   I personally provided the [facility] inhaler use teaching. After the teaching patient was able to demonstrate it's use effectively. All questions were answered        Assessment & Plan:

## 2012-01-27 ENCOUNTER — Encounter: Payer: Self-pay | Admitting: Internal Medicine

## 2012-02-01 ENCOUNTER — Telehealth: Payer: Self-pay | Admitting: *Deleted

## 2012-02-01 DIAGNOSIS — Z0000 Encounter for general adult medical examination without abnormal findings: Secondary | ICD-10-CM

## 2012-02-01 NOTE — Telephone Encounter (Signed)
Message copied by Merrilyn Puma on Tue Feb 01, 2012  3:14 PM ------      Message from: Etheleen Sia      Created: Wed Jan 26, 2012  4:42 PM      Regarding: LABS       PHYSICAL LABS FOR MARCH

## 2012-02-01 NOTE — Telephone Encounter (Signed)
CPE labs entered.  

## 2012-07-28 ENCOUNTER — Encounter: Payer: Managed Care, Other (non HMO) | Admitting: Internal Medicine

## 2012-10-07 ENCOUNTER — Other Ambulatory Visit: Payer: Self-pay | Admitting: Internal Medicine

## 2013-04-09 ENCOUNTER — Encounter: Payer: Self-pay | Admitting: Internal Medicine

## 2013-04-09 ENCOUNTER — Ambulatory Visit (INDEPENDENT_AMBULATORY_CARE_PROVIDER_SITE_OTHER): Payer: Managed Care, Other (non HMO) | Admitting: Internal Medicine

## 2013-04-09 VITALS — BP 142/92 | HR 76 | Temp 98.5°F | Resp 16 | Ht 64.0 in | Wt 198.0 lb

## 2013-04-09 DIAGNOSIS — Z23 Encounter for immunization: Secondary | ICD-10-CM

## 2013-04-09 DIAGNOSIS — Z0000 Encounter for general adult medical examination without abnormal findings: Secondary | ICD-10-CM

## 2013-04-09 MED ORDER — VITAMIN D 1000 UNITS PO TABS: 1000.0000 [IU] | ORAL_TABLET | Freq: Every day | ORAL | Status: AC

## 2013-04-09 MED ORDER — TRIAMCINOLONE ACETONIDE 0.5 % EX CREA: 1.0000 "application " | TOPICAL_CREAM | Freq: Three times a day (TID) | CUTANEOUS | Status: DC

## 2013-04-09 MED ORDER — VALSARTAN 320 MG PO TABS: 320.0000 mg | ORAL_TABLET | Freq: Every day | ORAL | Status: DC

## 2013-04-09 NOTE — Progress Notes (Signed)
Pre visit review using our clinic review tool, if applicable. No additional management support is needed unless otherwise documented below in the visit note. 

## 2013-04-09 NOTE — Progress Notes (Signed)
  Subjective:    HPI   The patient is here for a wellness exam. The patient has been doing well overall without major physical or psychological issues going on lately. The patient needs to address  chronic hypertension F/u allergies  Wt Readings from Last 3 Encounters:  04/09/13 198 lb (89.812 kg)  01/26/12 199 lb (90.266 kg)  09/17/11 194 lb (87.998 kg)   BP Readings from Last 3 Encounters:  04/09/13 142/92  01/26/12 128/90  09/17/11 140/100      Review of Systems  Constitutional: Negative for fever, chills, activity change, appetite change, fatigue and unexpected weight change.  HENT: Positive for rhinorrhea. Negative for congestion, facial swelling, mouth sores, sinus pressure and voice change.   Eyes: Positive for discharge. Negative for photophobia, pain, redness, itching and visual disturbance.  Respiratory: Negative for cough, chest tightness and shortness of breath.   Gastrointestinal: Negative for nausea and abdominal pain.  Genitourinary: Negative for frequency, difficulty urinating and vaginal pain.  Musculoskeletal: Negative for back pain and gait problem.  Skin: Negative for pallor and rash.  Neurological: Negative for dizziness, tremors, weakness, numbness and headaches.  Psychiatric/Behavioral: Negative for confusion and sleep disturbance.       Objective:   Physical Exam  Constitutional: She appears well-developed. No distress.  HENT:  Head: Normocephalic.  Right Ear: External ear normal.  Left Ear: External ear normal.  Nose: Nose normal.  Mouth/Throat: Oropharynx is clear and moist.  Eyes: Conjunctivae are normal. Pupils are equal, round, and reactive to light. Right eye exhibits no discharge. Left eye exhibits no discharge.  Neck: Normal range of motion. Neck supple. No JVD present. No tracheal deviation present. No thyromegaly present.  Cardiovascular: Normal rate, regular rhythm and normal heart sounds.   Pulmonary/Chest: No stridor. No  respiratory distress. She has no wheezes.  Abdominal: Soft. Bowel sounds are normal. She exhibits no distension and no mass. There is no tenderness. There is no rebound and no guarding.  Musculoskeletal: She exhibits no edema and no tenderness.  Lymphadenopathy:    She has no cervical adenopathy.  Neurological: She displays normal reflexes. No cranial nerve deficit. She exhibits normal muscle tone. Coordination normal.  Skin: No rash noted. No erythema.  Psychiatric: She has a normal mood and affect. Her behavior is normal. Judgment and thought content normal.     Lab Results  Component Value Date   WBC 8.1 04/16/2007   HGB 10.7* 04/16/2007   HCT 31.1* 04/16/2007   PLT 293 04/16/2007   GLUCOSE 128* 04/16/2007   ALT 34 04/11/2007   AST 26 04/11/2007   NA 136 04/16/2007   K 3.2 DELTA CHECK NOTED* 04/16/2007   CL 97 04/16/2007   CREATININE 0.63 04/16/2007   BUN 2* 04/16/2007   CO2 31 04/16/2007   INR 0.9 04/11/2007        Assessment & Plan:

## 2013-04-09 NOTE — Patient Instructions (Signed)
Gluten free trial (no wheat products) for 4-6 weeks. OK to use gluten-free bread and gluten-free pasta.  Milk free trial (no milk, ice cream, cheese and yogurt) for 4-6 weeks. OK to use almond, coconut, rice or soy milk. "Almond breeze" brand tastes good.  

## 2013-04-09 NOTE — Assessment & Plan Note (Signed)
We discussed age appropriate health related issues, including available/recomended screening tests and vaccinations. We discussed a need for adhering to healthy diet and exercise. Labs/EKG were reviewed/ordered. All questions were answered.   

## 2013-04-14 ENCOUNTER — Other Ambulatory Visit: Payer: Self-pay | Admitting: Internal Medicine

## 2013-04-17 ENCOUNTER — Other Ambulatory Visit: Payer: Self-pay | Admitting: Internal Medicine

## 2013-04-24 ENCOUNTER — Other Ambulatory Visit: Payer: Self-pay | Admitting: Obstetrics and Gynecology

## 2013-04-25 ENCOUNTER — Telehealth: Payer: Self-pay | Admitting: *Deleted

## 2013-04-25 NOTE — Telephone Encounter (Signed)
Pt called states while on Diovan BP is still elevated.  She is requesting to go back to Valsartan 320mg  instead.  Please advise

## 2013-04-25 NOTE — Telephone Encounter (Signed)
Noted. Pls try Valsartan HCT 320-25 one qd Thx

## 2013-04-26 MED ORDER — HYDROCHLOROTHIAZIDE 25 MG PO TABS: 25.0000 mg | ORAL_TABLET | Freq: Every day | ORAL | Status: DC

## 2013-04-26 MED ORDER — VALSARTAN-HYDROCHLOROTHIAZIDE 320-25 MG PO TABS: 1.0000 | ORAL_TABLET | Freq: Every day | ORAL | Status: DC

## 2013-04-26 NOTE — Telephone Encounter (Signed)
OK OK HCTZ Rx x 45 days as well Thx

## 2013-04-26 NOTE — Telephone Encounter (Signed)
Left detailed message on VM that Rx sent  

## 2013-04-26 NOTE — Telephone Encounter (Signed)
Spoke with pt, she states she just picked up the Valsartan 160mg  Rx.  She is requesting to take two tablets until this Rx is complete due to the price of medication.  Also if a Rx can be written for HCTZ 25mg  for 45 days to take along with Valsartan until Rx is complete.  Please advise

## 2013-05-27 ENCOUNTER — Other Ambulatory Visit: Payer: Self-pay | Admitting: Internal Medicine

## 2013-06-05 ENCOUNTER — Telehealth: Payer: Self-pay | Admitting: *Deleted

## 2013-06-05 NOTE — Telephone Encounter (Signed)
Notified pt of MD's response. Notified pharmacy generic form of Diovan is acceptable, but pt prefers brand of Diovan. Pharmacy is faxing Misty StanleyStacey a PA for the Diovan.

## 2013-06-05 NOTE — Telephone Encounter (Signed)
Ok Generic Labs were ordered in Nov. OK to come tomorrow Thx

## 2013-06-05 NOTE — Telephone Encounter (Signed)
Patient phoned stating that her pharmacy informed her that the only way they could fill Diovan (brand name v generic) is for the MD to complete a form to that effect.  Also, she inquired about labs that she was supposed to have completed with last OV (04/09/13) and requests to be able to come tomorrow & have them drawn then.  Please advise.   CB# [phone]

## 2013-06-06 NOTE — Telephone Encounter (Signed)
Patient has phoned in again at 1525 today DEMANDING that her diovan prescription be resolved.  Please advise.

## 2013-06-06 NOTE — Telephone Encounter (Signed)
[facility] Triage Call Report Triage Record Num: [id number] Operator: Karenann CaiPeyton Ware Patient Name: Elizabeth MaudlinLisa Ware Call Date & Time: 06/05/2013 5:55:49PM Patient Phone: [phone] PCP: Sonda PrimesAlex Plotnikov Patient Gender: Female PCP Fax : [phone] Patient DOB: 1960-08-18 Practice Name: [facility] Reason for Call: Caller: Elizabeth Ware/Spouse; PCP: Sonda PrimesPlotnikov, Alex (Adults only); CB#: [phone]; reason for call: medication need. Patient is requesting brand Diovan. Patient requests brand Diovan. [phone]. Pharmacist advised that brand med will require prior authorization. This information shared with Elizabeth Ware. No triage. Protocol(s) Used: Medication Questions - Adult Recommended Outcome per Protocol: Provided Health Information Reason for Outcome: Caller has medication question(s) that was answered with available resources Care Advice: ~ 01/

## 2013-06-06 NOTE — Telephone Encounter (Signed)
This problem is created by [employer]'s health insurance company. There is no reason to be frustrated with us. We will do what we can, however, [employer]isa's  insurance company will have the last word. Sorry! AP

## 2013-06-07 ENCOUNTER — Telehealth: Payer: Self-pay | Admitting: *Deleted

## 2013-06-07 NOTE — Telephone Encounter (Signed)
I called and answered all questions for Diovan HCT 320/25 mg PA. PA is approved x 12 month. Case ID # [id number].  If brand name is required, must:  Fax letter of Medical Necessity for [employer] exception to fax # [phone]  Left mess for patient to call back to see if brand name is neccessary.

## 2013-06-08 NOTE — Telephone Encounter (Signed)
Letter printed/pending MD sig. 

## 2013-06-08 NOTE — Telephone Encounter (Signed)
Patient phoned back in, per Stacey's message left for her yesterday, stating that the brand name product IS A NECESSITY & states that brand name product is what MD recommended. States she needs answer today b/c she is out. I told pt that since she is out, I would check to see if we have any samples.  CB# [phone]  No samples available.  Notified patient.

## 2013-06-08 NOTE — Telephone Encounter (Signed)
Pharmacy called back to say the patient has agreed to take Diovan 320 mg and HCTZ 25 mg. She is going to give the patient two separate rxs. I informed her this is Ok with MD.

## 2013-06-08 NOTE — Telephone Encounter (Signed)
Pt needs letter --Please advise what to put in letter.

## 2013-06-08 NOTE — Telephone Encounter (Signed)
We do not get this Rx samples any longer Pls print a letter Thx

## 2013-09-11 ENCOUNTER — Other Ambulatory Visit: Payer: Self-pay | Admitting: *Deleted

## 2013-09-11 ENCOUNTER — Telehealth: Payer: Self-pay

## 2013-09-11 NOTE — Telephone Encounter (Signed)
The patient called and stated she needs her Diovan called in, in the generic form for her insurance to pay   Pt callback - [phone]

## 2013-09-11 NOTE — Telephone Encounter (Signed)
Pt called states she needs the [facility]alsartan 320 mg [facility]  [address]  Pt further states she has the Rx with refills of the HCTZ.  The Diovan is too expensive, she needs the separate Rx written as generic.  Please advise

## 2013-09-12 MED ORDER — VALSARTAN 320 MG PO TABS: 320.0000 mg | ORAL_TABLET | Freq: Every day | ORAL | Status: DC

## 2013-09-12 NOTE — Telephone Encounter (Signed)
I think Valsartan HCT is a generic one (plain Valsartan is a brand only). Ok to ref x 1 year Thx

## 2013-10-08 ENCOUNTER — Encounter: Payer: Self-pay | Admitting: Internal Medicine

## 2013-10-08 ENCOUNTER — Ambulatory Visit (INDEPENDENT_AMBULATORY_CARE_PROVIDER_SITE_OTHER): Payer: Managed Care, Other (non HMO) | Admitting: Internal Medicine

## 2013-10-08 VITALS — BP 140/94 | HR 96 | Resp 16 | Wt 191.0 lb

## 2013-10-08 DIAGNOSIS — L719 Rosacea, unspecified: Secondary | ICD-10-CM

## 2013-10-08 DIAGNOSIS — I10 Essential (primary) hypertension: Secondary | ICD-10-CM

## 2013-10-08 DIAGNOSIS — L259 Unspecified contact dermatitis, unspecified cause: Secondary | ICD-10-CM

## 2013-10-08 DIAGNOSIS — H109 Unspecified conjunctivitis: Secondary | ICD-10-CM

## 2013-10-08 DIAGNOSIS — J310 Chronic rhinitis: Secondary | ICD-10-CM

## 2013-10-08 MED ORDER — METHYLPREDNISOLONE ACETATE 80 MG/ML IJ SUSP
80.0000 mg | Freq: Once | INTRAMUSCULAR | Status: AC
  Administered 2013-10-08: 80 mg via INTRAMUSCULAR

## 2013-10-08 MED ORDER — ERYTHROMYCIN 5 MG/GM OP OINT: 1.0000 "application " | TOPICAL_OINTMENT | Freq: Every day | OPHTHALMIC | Status: DC

## 2013-10-08 MED ORDER — TRIAMCINOLONE ACETONIDE 0.5 % EX CREA: 1.0000 "application " | TOPICAL_CREAM | Freq: Three times a day (TID) | CUTANEOUS | Status: DC

## 2013-10-08 MED ORDER — AZILSARTAN MEDOXOMIL 80 MG PO TABS: 1.0000 | ORAL_TABLET | Freq: Every day | ORAL | Status: DC

## 2013-10-08 MED ORDER — CLINDAMYCIN PHOSPHATE 1 % EX LOTN: TOPICAL_LOTION | Freq: Two times a day (BID) | CUTANEOUS | Status: DC

## 2013-10-08 MED ORDER — LORATADINE 10 MG PO TABS: 10.0000 mg | ORAL_TABLET | Freq: Every day | ORAL | Status: DC

## 2013-10-08 NOTE — Progress Notes (Signed)
Pre visit review using our clinic review tool, if applicable. No additional management support is needed unless otherwise documented below in the visit note. 

## 2013-10-08 NOTE — Assessment & Plan Note (Signed)
Cleocin T lotion

## 2013-10-08 NOTE — Patient Instructions (Signed)
Remove nail polish

## 2013-10-08 NOTE — Progress Notes (Signed)
  Subjective:    HPI   C/o generic valsartan - not working C/o eyes watering The patient needs to address  chronic hypertension F/u allergies  Wt Readings from Last 3 Encounters:  10/08/13 191 lb (86.637 kg)  04/09/13 198 lb (89.812 kg)  01/26/12 199 lb (90.266 kg)   BP Readings from Last 3 Encounters:  10/08/13 140/94  04/09/13 142/92  01/26/12 128/90      Review of Systems  Constitutional: Negative for fever, chills, activity change, appetite change, fatigue and unexpected weight change.  HENT: Positive for rhinorrhea. Negative for congestion, facial swelling, mouth sores, sinus pressure and voice change.   Eyes: Positive for discharge. Negative for photophobia, pain, redness, itching and visual disturbance.  Respiratory: Negative for cough, chest tightness and shortness of breath.   Gastrointestinal: Negative for nausea and abdominal pain.  Genitourinary: Negative for frequency, difficulty urinating and vaginal pain.  Musculoskeletal: Negative for back pain and gait problem.  Skin: Negative for pallor and rash.  Neurological: Negative for dizziness, tremors, weakness, numbness and headaches.  Psychiatric/Behavioral: Negative for confusion and sleep disturbance.       Objective:   Physical Exam  Constitutional: She appears well-developed. No distress.  HENT:  Head: Normocephalic.  Right Ear: External ear normal.  Left Ear: External ear normal.  Nose: Nose normal.  Mouth/Throat: Oropharynx is clear and moist.  Eyes: Conjunctivae are normal. Pupils are equal, round, and reactive to light. Right eye exhibits no discharge. Left eye exhibits no discharge.  Neck: Normal range of motion. Neck supple. No JVD present. No tracheal deviation present. No thyromegaly present.  Cardiovascular: Normal rate, regular rhythm and normal heart sounds.   Pulmonary/Chest: No stridor. No respiratory distress. She has no wheezes.  Abdominal: Soft. Bowel sounds are normal. She exhibits no  distension and no mass. There is no tenderness. There is no rebound and no guarding.  Musculoskeletal: She exhibits no edema and no tenderness.  Lymphadenopathy:    She has no cervical adenopathy.  Neurological: She displays normal reflexes. No cranial nerve deficit. She exhibits normal muscle tone. Coordination normal.  Skin: Rash noted. There is erythema.  Psychiatric: She has a normal mood and affect. Her behavior is normal. Judgment and thought content normal.  acne/rosacea Eyes somewhat eryth and swollen: eczema around eyes    Lab Results  Component Value Date   WBC 8.1 04/16/2007   HGB 10.7* 04/16/2007   HCT 31.1* 04/16/2007   PLT 293 04/16/2007   GLUCOSE 128* 04/16/2007   ALT 34 04/11/2007   AST 26 04/11/2007   NA 136 04/16/2007   K 3.2 DELTA CHECK NOTED* 04/16/2007   CL 97 04/16/2007   CREATININE 0.63 04/16/2007   BUN 2* 04/16/2007   CO2 31 04/16/2007   INR 0.9 04/11/2007        Assessment & Plan:

## 2013-10-08 NOTE — Assessment & Plan Note (Signed)
5/15 B eyelids Remove nail polish Use Triamcinolone tid

## 2013-10-08 NOTE — Assessment & Plan Note (Signed)
Will try Edarbi 80 mg to improve BP control

## 2013-10-08 NOTE — Assessment & Plan Note (Signed)
5/15 B Erythro oint qhs

## 2013-10-25 ENCOUNTER — Telehealth: Payer: Self-pay | Admitting: *Deleted

## 2013-10-25 MED ORDER — TELMISARTAN 80 MG PO TABS: 80.0000 mg | ORAL_TABLET | Freq: Every day | ORAL | Status: DC

## 2013-10-25 NOTE — Telephone Encounter (Signed)
Pt called states Elizabeth Ware is too expensive, however she has taken her last pill this morning.  I gave her samples until Dr Posey Rea returns to office to address.  Please advise

## 2013-10-25 NOTE — Telephone Encounter (Signed)
We can try generic Micardis 80 mg a day - emailed Thx

## 2013-10-26 NOTE — Telephone Encounter (Signed)
Spoke with pt advised Rx sent 

## 2013-11-16 ENCOUNTER — Ambulatory Visit: Payer: Managed Care, Other (non HMO) | Admitting: Internal Medicine

## 2013-11-16 DIAGNOSIS — Z0289 Encounter for other administrative examinations: Secondary | ICD-10-CM

## 2013-12-07 ENCOUNTER — Ambulatory Visit: Payer: Managed Care, Other (non HMO) | Admitting: Internal Medicine

## 2013-12-07 DIAGNOSIS — Z0289 Encounter for other administrative examinations: Secondary | ICD-10-CM

## 2013-12-20 ENCOUNTER — Other Ambulatory Visit: Payer: Self-pay | Admitting: *Deleted

## 2013-12-20 MED ORDER — TELMISARTAN 80 MG PO TABS: 80.0000 mg | ORAL_TABLET | Freq: Every day | ORAL | Status: DC

## 2013-12-27 ENCOUNTER — Other Ambulatory Visit: Payer: Self-pay

## 2013-12-27 MED ORDER — TELMISARTAN 80 MG PO TABS: 80.0000 mg | ORAL_TABLET | Freq: Every day | ORAL | Status: DC

## 2013-12-31 ENCOUNTER — Encounter: Payer: Self-pay | Admitting: Internal Medicine

## 2013-12-31 ENCOUNTER — Ambulatory Visit (INDEPENDENT_AMBULATORY_CARE_PROVIDER_SITE_OTHER): Payer: Managed Care, Other (non HMO) | Admitting: Internal Medicine

## 2013-12-31 VITALS — BP 135/85 | HR 118 | Temp 98.3°F | Wt 192.0 lb

## 2013-12-31 DIAGNOSIS — F411 Generalized anxiety disorder: Secondary | ICD-10-CM

## 2013-12-31 DIAGNOSIS — I10 Essential (primary) hypertension: Secondary | ICD-10-CM

## 2013-12-31 MED ORDER — TRANXENE-T 7.5 MG PO TABS: 7.5000 mg | ORAL_TABLET | Freq: Two times a day (BID) | ORAL | Status: DC | PRN

## 2013-12-31 NOTE — Assessment & Plan Note (Signed)
Continue with current prescription therapy as reflected on the Med list. BP is OK at home

## 2013-12-31 NOTE — Assessment & Plan Note (Signed)
Continue with current prescription therapy as reflected on the Med list.  

## 2013-12-31 NOTE — Progress Notes (Signed)
  Subjective:    HPI    The patient needs to address  chronic hypertension F/u allergies, anxiety  Wt Readings from Last 3 Encounters:  12/31/13 192 lb (87.091 kg)  10/08/13 191 lb (86.637 kg)  04/09/13 198 lb (89.812 kg)   BP Readings from Last 3 Encounters:  12/31/13 156/94  10/08/13 140/94  04/09/13 142/92      Review of Systems  Constitutional: Negative for fever, chills, activity change, appetite change, fatigue and unexpected weight change.  HENT: Positive for rhinorrhea. Negative for congestion, facial swelling, mouth sores, sinus pressure and voice change.   Eyes: Positive for discharge. Negative for photophobia, pain, redness, itching and visual disturbance.  Respiratory: Negative for cough, chest tightness and shortness of breath.   Gastrointestinal: Negative for nausea and abdominal pain.  Genitourinary: Negative for frequency, difficulty urinating and vaginal pain.  Musculoskeletal: Negative for back pain and gait problem.  Skin: Negative for pallor and rash.  Neurological: Negative for dizziness, tremors, weakness, numbness and headaches.  Psychiatric/Behavioral: Negative for confusion and sleep disturbance.       Objective:   Physical Exam  Constitutional: She appears well-developed. No distress.  HENT:  Head: Normocephalic.  Right Ear: External ear normal.  Left Ear: External ear normal.  Nose: Nose normal.  Mouth/Throat: Oropharynx is clear and moist.  Eyes: Conjunctivae are normal. Pupils are equal, round, and reactive to light. Right eye exhibits no discharge. Left eye exhibits no discharge.  Neck: Normal range of motion. Neck supple. No JVD present. No tracheal deviation present. No thyromegaly present.  Cardiovascular: Normal rate, regular rhythm and normal heart sounds.   Pulmonary/Chest: No stridor. No respiratory distress. She has no wheezes.  Abdominal: Soft. Bowel sounds are normal. She exhibits no distension and no mass. There is no  tenderness. There is no rebound and no guarding.  Musculoskeletal: She exhibits no edema and no tenderness.  Lymphadenopathy:    She has no cervical adenopathy.  Neurological: She displays normal reflexes. No cranial nerve deficit. She exhibits normal muscle tone. Coordination normal.  Skin: Rash noted. There is erythema.  Psychiatric: She has a normal mood and affect. Her behavior is normal. Judgment and thought content normal.  acne/rosacea Eyes somewhat eryth and swollen: eczema around eyes    Lab Results  Component Value Date   WBC 8.1 04/16/2007   HGB 10.7* 04/16/2007   HCT 31.1* 04/16/2007   PLT 293 04/16/2007   GLUCOSE 128* 04/16/2007   ALT 34 04/11/2007   AST 26 04/11/2007   NA 136 04/16/2007   K 3.2 DELTA CHECK NOTED* 04/16/2007   CL 97 04/16/2007   CREATININE 0.63 04/16/2007   BUN 2* 04/16/2007   CO2 31 04/16/2007   INR 0.9 04/11/2007        Assessment & Plan:

## 2013-12-31 NOTE — Progress Notes (Signed)
Pre visit review using our clinic review tool, if applicable. No additional management support is needed unless otherwise documented below in the visit note. 

## 2013-12-31 NOTE — Addendum Note (Signed)
Addended by: Tresa GarterPLOTNIKOV, ALEKSEI V on: 12/31/2013 03:39 PM   Modules accepted: Orders

## 2014-01-01 ENCOUNTER — Telehealth: Payer: Self-pay | Admitting: Internal Medicine

## 2014-01-01 NOTE — Telephone Encounter (Signed)
Relevant patient education assigned to patient using Emmi. ° °

## 2014-02-06 ENCOUNTER — Telehealth: Payer: Self-pay | Admitting: Internal Medicine

## 2014-02-06 NOTE — Telephone Encounter (Signed)
Patients dentist found a spot on her tung and referred her to Dr. Crosby Oyster.  Dr. Crosby Oyster did not take [facility] when she got there.  She is requesting a referral from Dr. Posey Rea to a oral Surgeon that takes [facility].  Please advise.

## 2014-02-07 NOTE — Telephone Encounter (Signed)
Patient would rather not come in for a OV.  She states she will go back to her Dentist for a referral or try going through her insurance company.

## 2014-02-07 NOTE — Telephone Encounter (Signed)
pls come for an OV Bring an Xray report if she has one Thx

## 2014-02-07 NOTE — Telephone Encounter (Signed)
Ok Thx 

## 2014-02-26 ENCOUNTER — Ambulatory Visit (INDEPENDENT_AMBULATORY_CARE_PROVIDER_SITE_OTHER): Payer: Managed Care, Other (non HMO) | Admitting: Internal Medicine

## 2014-02-26 ENCOUNTER — Encounter: Payer: Self-pay | Admitting: Internal Medicine

## 2014-02-26 VITALS — BP 146/96 | HR 115 | Temp 98.8°F | Resp 14 | Wt 189.6 lb

## 2014-02-26 DIAGNOSIS — K1321 Leukoplakia of oral mucosa, including tongue: Secondary | ICD-10-CM

## 2014-02-26 DIAGNOSIS — J209 Acute bronchitis, unspecified: Secondary | ICD-10-CM

## 2014-02-26 DIAGNOSIS — J069 Acute upper respiratory infection, unspecified: Secondary | ICD-10-CM

## 2014-02-26 MED ORDER — AZITHROMYCIN 250 MG PO TABS: ORAL_TABLET | ORAL | Status: DC

## 2014-02-26 NOTE — Progress Notes (Signed)
   Subjective:    Patient ID: Elizabeth Ware, female    DOB: 11/05/1960, 53 y.o.   MRN: [id number]  HPI   Symptoms began 02/22/14 as chills followed by myalgias and arthralgias  after being in the rain. She had a low-grade fever  At this time her major symptoms are head chest congestion with a nonproductive cough.  She has frontal headache and facial pain. She also has sore throat, earache, and some shortness of breath.  She's been using Claritin and Flonase with minimal response.    Review of Systems   She denies nasal purulence, dental pain, otic discharge, purulent sputum, itchy, watery eyes, or sneezing.  She has had a white patch over the right lateral tongue since July. She was referred by her Dentist to an Transport plannerral Surgeon but her insurance was not accepted by them.     Objective:   Physical Exam  She appears fatigued but in no acute distress There's a small white lesion along the right lateral aspect of the tongue Nares are dry and erythematous.  General appearance:good health ;well nourished; no acute distress or increased work of breathing is present.  No  lymphadenopathy about the head, neck, or axilla noted.  Eyes: No conjunctival inflammation or lid edema is present. There is no scleral icterus. Ears:  External ear exam shows no significant lesions or deformities.  Otoscopic examination reveals clear canals, tympanic membranes are intact bilaterally without bulging, retraction, inflammation or discharge. Nose:  External nasal examination shows no deformity or inflammation.No septal dislocation or deviation.No obstruction to airflow.  Oral exam: Dental hygiene is good; lips and gums are healthy appearing.There is no oropharyngeal erythema or exudate noted.  Neck:  No deformities, thyromegaly, masses, or tenderness noted.   Supple with full range of motion without pain.  Heart:  Normal rate and regular rhythm. S1 and S2 normal without gallop, murmur, click, rub or other extra  sounds.  Lungs:Chest clear to auscultation; no wheezes, rhonchi,rales ,or rubs present.No increased work of breathing.   Extremities:  No cyanosis, edema, or clubbing  noted  Skin: Warm & dry w/o jaundice or tenting.         Assessment & Plan:  #1 acute bronchitis w/o bronchospasm #2 URI, acute #3 tongue lesion ; R/O leukoplakia vs lichen planus Plan: See orders and recommendations

## 2014-02-26 NOTE — Progress Notes (Signed)
Pre visit review using our clinic review tool, if applicable. No additional management support is needed unless otherwise documented below in the visit note. 

## 2014-02-26 NOTE — Patient Instructions (Signed)

## 2014-02-27 ENCOUNTER — Encounter: Payer: Managed Care, Other (non HMO) | Admitting: Internal Medicine

## 2014-03-26 ENCOUNTER — Other Ambulatory Visit: Payer: Self-pay | Admitting: Internal Medicine

## 2014-06-12 ENCOUNTER — Other Ambulatory Visit: Payer: Self-pay | Admitting: Obstetrics and Gynecology

## 2014-06-13 LAB — CYTOLOGY - PAP

## 2014-06-19 ENCOUNTER — Encounter: Payer: Self-pay | Admitting: Internal Medicine

## 2014-06-19 ENCOUNTER — Other Ambulatory Visit: Payer: Self-pay | Admitting: Obstetrics and Gynecology

## 2014-06-19 ENCOUNTER — Ambulatory Visit (INDEPENDENT_AMBULATORY_CARE_PROVIDER_SITE_OTHER): Payer: Managed Care, Other (non HMO) | Admitting: Internal Medicine

## 2014-06-19 ENCOUNTER — Other Ambulatory Visit (INDEPENDENT_AMBULATORY_CARE_PROVIDER_SITE_OTHER): Payer: Managed Care, Other (non HMO)

## 2014-06-19 VITALS — BP 138/94 | HR 116 | Temp 98.5°F | Wt 182.0 lb

## 2014-06-19 DIAGNOSIS — R81 Glycosuria: Secondary | ICD-10-CM

## 2014-06-19 DIAGNOSIS — E119 Type 2 diabetes mellitus without complications: Secondary | ICD-10-CM

## 2014-06-19 DIAGNOSIS — I10 Essential (primary) hypertension: Secondary | ICD-10-CM

## 2014-06-19 DIAGNOSIS — H538 Other visual disturbances: Secondary | ICD-10-CM | POA: Insufficient documentation

## 2014-06-19 DIAGNOSIS — R928 Other abnormal and inconclusive findings on diagnostic imaging of breast: Secondary | ICD-10-CM

## 2014-06-19 LAB — HEPATIC FUNCTION PANEL
ALT: 40 U/L — AB (ref 0–35)
AST: 29 U/L (ref 0–37)
Albumin: 4.5 g/dL (ref 3.5–5.2)
Alkaline Phosphatase: 67 U/L (ref 39–117)
Bilirubin, Direct: 0.1 mg/dL (ref 0.0–0.3)
TOTAL PROTEIN: 7.6 g/dL (ref 6.0–8.3)
Total Bilirubin: 0.8 mg/dL (ref 0.2–1.2)

## 2014-06-19 LAB — CBC WITH DIFFERENTIAL/PLATELET
Basophils Absolute: 0 10*3/uL (ref 0.0–0.1)
Basophils Relative: 0.4 % (ref 0.0–3.0)
EOS PCT: 1 % (ref 0.0–5.0)
Eosinophils Absolute: 0.1 10*3/uL (ref 0.0–0.7)
HCT: 39.5 % (ref 36.0–46.0)
Hemoglobin: 13.8 g/dL (ref 12.0–15.0)
Lymphocytes Relative: 27.6 % (ref 12.0–46.0)
Lymphs Abs: 2.1 10*3/uL (ref 0.7–4.0)
MCHC: 34.9 g/dL (ref 30.0–36.0)
MCV: 83.2 fl (ref 78.0–100.0)
MONO ABS: 0.4 10*3/uL (ref 0.1–1.0)
Monocytes Relative: 5.7 % (ref 3.0–12.0)
NEUTROS ABS: 4.9 10*3/uL (ref 1.4–7.7)
NEUTROS PCT: 65.3 % (ref 43.0–77.0)
Platelets: 357 10*3/uL (ref 150.0–400.0)
RBC: 4.75 Mil/uL (ref 3.87–5.11)
RDW: 12.3 % (ref 11.5–15.5)
WBC: 7.5 10*3/uL (ref 4.0–10.5)

## 2014-06-19 LAB — BASIC METABOLIC PANEL
BUN: 12 mg/dL (ref 6–23)
CALCIUM: 9.6 mg/dL (ref 8.4–10.5)
CHLORIDE: 98 meq/L (ref 96–112)
CO2: 26 mEq/L (ref 19–32)
Creatinine, Ser: 0.8 mg/dL (ref 0.40–1.20)
GFR: 79.54 mL/min (ref >60.00)
Glucose, Bld: 247 mg/dL — ABNORMAL HIGH (ref 70–99)
Potassium: 3.6 mEq/L (ref 3.5–5.1)
Sodium: 136 mEq/L (ref 135–145)

## 2014-06-19 LAB — URINALYSIS
Bilirubin Urine: NEGATIVE
Hgb urine dipstick: NEGATIVE
KETONES UR: 15 — AB
Leukocytes, UA: NEGATIVE
Nitrite: NEGATIVE
Specific Gravity, Urine: 1.025 (ref 1.000–1.030)
Total Protein, Urine: NEGATIVE
URINE GLUCOSE: 500 — AB
UROBILINOGEN UA: 0.2 (ref 0.0–1.0)
pH: 5.5 (ref 5.0–8.0)

## 2014-06-19 LAB — HEMOGLOBIN A1C: HEMOGLOBIN A1C: 11.8 % — AB (ref 4.6–6.5)

## 2014-06-19 LAB — GLUCOSE, POCT (MANUAL RESULT ENTRY): POC GLUCOSE: 257 mg/dL — AB (ref 70–99)

## 2014-06-19 LAB — TSH: TSH: 1.97 u[IU]/mL (ref 0.35–4.50)

## 2014-06-19 MED ORDER — AZITHROMYCIN 250 MG PO TABS: ORAL_TABLET | ORAL | Status: DC

## 2014-06-19 MED ORDER — ONETOUCH DELICA LANCETS FINE MISC: 1.0000 | Freq: Every day | Status: AC | PRN

## 2014-06-19 MED ORDER — METFORMIN HCL 500 MG PO TABS: 500.0000 mg | ORAL_TABLET | Freq: Two times a day (BID) | ORAL | Status: DC

## 2014-06-19 MED ORDER — GLUCOSE BLOOD VI STRP: ORAL_STRIP | Status: DC

## 2014-06-19 NOTE — Patient Instructions (Signed)
Low carb diet 

## 2014-06-19 NOTE — Assessment & Plan Note (Addendum)
Pos due to DM Dr Charlotte SanesMcCuen - will consult

## 2014-06-19 NOTE — Assessment & Plan Note (Signed)
Discussed OneTouch Verio Start Metformin

## 2014-06-19 NOTE — Progress Notes (Signed)
Pre visit review using our clinic review tool, if applicable. No additional management support is needed unless otherwise documented below in the visit note. 

## 2014-06-19 NOTE — Assessment & Plan Note (Signed)
Cont to monitor

## 2014-06-19 NOTE — Progress Notes (Signed)
  Subjective:    HPI   C/o elevated sugar at Dr Ewell PoeAnderson's office (+UA)  The patient needs to address  chronic hypertension F/u allergies, anxiety  Wt Readings from Last 3 Encounters:  06/19/14 182 lb (82.555 kg)  02/26/14 189 lb 9 oz (85.985 kg)  12/31/13 192 lb (87.091 kg)   BP Readings from Last 3 Encounters:  06/19/14 138/94  02/26/14 146/96  12/31/13 135/85      Review of Systems  Constitutional: Negative for fever, chills, activity change, appetite change, fatigue and unexpected weight change.  HENT: Positive for rhinorrhea. Negative for congestion, facial swelling, mouth sores, sinus pressure and voice change.   Eyes: Positive for discharge. Negative for photophobia, pain, redness, itching and visual disturbance.  Respiratory: Negative for cough, chest tightness and shortness of breath.   Gastrointestinal: Negative for nausea and abdominal pain.  Genitourinary: Negative for frequency, difficulty urinating and vaginal pain.  Musculoskeletal: Negative for back pain and gait problem.  Skin: Negative for pallor and rash.  Neurological: Negative for dizziness, tremors, weakness, numbness and headaches.  Psychiatric/Behavioral: Negative for confusion and sleep disturbance.       Objective:   Physical Exam  Constitutional: She appears well-developed. No distress.  HENT:  Head: Normocephalic.  Right Ear: External ear normal.  Left Ear: External ear normal.  Nose: Nose normal.  Mouth/Throat: Oropharynx is clear and moist.  Eyes: Conjunctivae are normal. Pupils are equal, round, and reactive to light. Right eye exhibits no discharge. Left eye exhibits no discharge.  Neck: Normal range of motion. Neck supple. No JVD present. No tracheal deviation present. No thyromegaly present.  Cardiovascular: Normal rate, regular rhythm and normal heart sounds.   Pulmonary/Chest: No stridor. No respiratory distress. She has no wheezes.  Abdominal: Soft. Bowel sounds are normal. She  exhibits no distension and no mass. There is no tenderness. There is no rebound and no guarding.  Musculoskeletal: She exhibits no edema or tenderness.  Lymphadenopathy:    She has no cervical adenopathy.  Neurological: She displays normal reflexes. No cranial nerve deficit. She exhibits normal muscle tone. Coordination normal.  Skin: Rash noted. There is erythema.  Psychiatric: She has a normal mood and affect. Her behavior is normal. Judgment and thought content normal.  acne/rosacea Eyes somewhat eryth and swollen: eczema around eyes CBG 257    Lab Results  Component Value Date   WBC 8.1 04/16/2007   HGB 10.7* 04/16/2007   HCT 31.1* 04/16/2007   PLT 293 04/16/2007   GLUCOSE 128* 04/16/2007   ALT 34 04/11/2007   AST 26 04/11/2007   NA 136 04/16/2007   K 3.2 DELTA CHECK NOTED* 04/16/2007   CL 97 04/16/2007   CREATININE 0.63 04/16/2007   BUN 2* 04/16/2007   CO2 31 04/16/2007   INR 0.9 04/11/2007        Assessment & Plan:

## 2014-06-20 ENCOUNTER — Telehealth: Payer: Self-pay | Admitting: Internal Medicine

## 2014-06-20 NOTE — Telephone Encounter (Signed)
Patient returned our call regarding her lab results. No clinical staff members were able to take the call and patient refused to call back. I read her Dr. Loren RacerPlotnikov's notes and advised her that if she had any clinical questions that she would need to call back and speak with a clinical staff member. Pt understood results and had no further questions.

## 2014-06-24 ENCOUNTER — Telehealth: Payer: Self-pay | Admitting: Internal Medicine

## 2014-06-24 MED ORDER — EMPAGLIFLOZIN-LINAGLIPTIN 25-5 MG PO TABS: 1.0000 | ORAL_TABLET | Freq: Every morning | ORAL | Status: DC

## 2014-06-24 NOTE — Telephone Encounter (Signed)
OK to d/c Start Glyxambi 25/5 qd - pick up Rx and free Rx 30 d card Thx

## 2014-06-24 NOTE — Telephone Encounter (Signed)
Left vm for pt to callback 

## 2014-06-24 NOTE — Telephone Encounter (Signed)
Pt called stated metformin is making her sick, vomit and stomach cramp. Please advise, she doesn't feel too good when she takes this  Med.

## 2014-06-24 NOTE — Telephone Encounter (Signed)
Pt aware.

## 2014-06-27 ENCOUNTER — Telehealth: Payer: Self-pay | Admitting: Internal Medicine

## 2014-06-27 ENCOUNTER — Other Ambulatory Visit: Payer: Managed Care, Other (non HMO)

## 2014-06-27 MED ORDER — DOXYCYCLINE HYCLATE 100 MG PO TABS: 100.0000 mg | ORAL_TABLET | Freq: Two times a day (BID) | ORAL | Status: DC

## 2014-06-27 NOTE — Telephone Encounter (Signed)
Ok Doxycycline 100 mg po bid x 10 d Thx 

## 2014-06-27 NOTE — Telephone Encounter (Signed)
Pt called stated Dr. Macario GoldsPlot gave her zpak for ear infection last ov but she is not feeling any better. Pt was wondering if Dr. Macario GoldsPlot can give her something else. Pt is allergic to penicillin and Cipro is not working for her anymore.

## 2014-06-27 NOTE — Telephone Encounter (Signed)
Please help.

## 2014-06-28 NOTE — Telephone Encounter (Signed)
Pt informed

## 2014-07-01 ENCOUNTER — Ambulatory Visit
Admission: RE | Admit: 2014-07-01 | Discharge: 2014-07-01 | Disposition: A | Discharge status: Home / Self Care | Payer: Managed Care, Other (non HMO) | Source: Ambulatory Visit | Attending: Obstetrics and Gynecology | Admitting: Obstetrics and Gynecology

## 2014-07-01 ENCOUNTER — Other Ambulatory Visit: Payer: Managed Care, Other (non HMO)

## 2014-07-01 ENCOUNTER — Telehealth: Payer: Self-pay | Admitting: Internal Medicine

## 2014-07-01 DIAGNOSIS — R928 Other abnormal and inconclusive findings on diagnostic imaging of breast: Secondary | ICD-10-CM

## 2014-07-01 NOTE — Telephone Encounter (Signed)
Patient would like a call back in regards to lab results.  States someone called her from this office today.

## 2014-07-02 NOTE — Telephone Encounter (Signed)
Pt informed   Notes Recorded by Gypsy Loreaylor B Mitchell on 07/01/2014 at 4:28 PM Left message on pts voicemail to call back to receive lab results. Notes Recorded by Gypsy Loreaylor B Mitchell on 07/01/2014 at 4:09 PM Left message on pts voicemail to return call for lab results. Notes Recorded by Noreene Larssonndreasa R Andrews, CMA on 06/20/2014 at 9:23 AM Left message on home # to return call Notes Recorded by Noreene Larssonndreasa R Andrews, CMA on 06/20/2014 at 9:22 AM Left message on patient's mobile # to return call Notes Recorded by Tresa GarterAleksei Plotnikov V, MD on 06/19/2014 at 4:45 PM Misty StanleyStacey, please, inform patient that all labs are normal except for diabetic tests. Plan - as we discussed Thx

## 2014-07-24 ENCOUNTER — Encounter: Payer: Self-pay | Admitting: Internal Medicine

## 2014-07-24 ENCOUNTER — Ambulatory Visit (INDEPENDENT_AMBULATORY_CARE_PROVIDER_SITE_OTHER): Payer: Managed Care, Other (non HMO) | Admitting: Internal Medicine

## 2014-07-24 VITALS — BP 130/96 | HR 100 | Wt 176.0 lb

## 2014-07-24 DIAGNOSIS — E119 Type 2 diabetes mellitus without complications: Secondary | ICD-10-CM

## 2014-07-24 DIAGNOSIS — H538 Other visual disturbances: Secondary | ICD-10-CM

## 2014-07-24 DIAGNOSIS — I10 Essential (primary) hypertension: Secondary | ICD-10-CM

## 2014-07-24 MED ORDER — GLUCOSE BLOOD VI STRP: ORAL_STRIP | Status: DC

## 2014-07-24 NOTE — Progress Notes (Signed)
Pre visit review using our clinic review tool, if applicable. No additional management support is needed unless otherwise documented below in the visit note. 

## 2014-07-24 NOTE — Assessment & Plan Note (Addendum)
On Glyxambi Intolerant of Metformin  Better  Endocr ref Dr Elvera LennoxGherghe

## 2014-07-24 NOTE — Progress Notes (Signed)
  Subjective:    HPI   F/u DM: CBGs 140-170 in am's  The patient needs to address  chronic hypertension F/u allergies, anxiety  Wt Readings from Last 3 Encounters:  07/24/14 176 lb (79.833 kg)  06/19/14 182 lb (82.555 kg)  02/26/14 189 lb 9 oz (85.985 kg)   BP Readings from Last 3 Encounters:  07/24/14 130/96  06/19/14 138/94  02/26/14 146/96      Review of Systems  Constitutional: Negative for fever, chills, activity change, appetite change, fatigue and unexpected weight change.  HENT: Negative for congestion, facial swelling, mouth sores, rhinorrhea, sinus pressure and voice change.   Eyes: Negative for photophobia, pain, discharge, redness, itching and visual disturbance.  Respiratory: Negative for cough, chest tightness and shortness of breath.   Gastrointestinal: Negative for nausea and abdominal pain.  Genitourinary: Negative for frequency, difficulty urinating and vaginal pain.  Musculoskeletal: Negative for back pain and gait problem.  Skin: Negative for pallor and rash.  Neurological: Negative for dizziness, tremors, weakness, numbness and headaches.  Psychiatric/Behavioral: Negative for confusion and sleep disturbance.       Objective:   Physical Exam  Constitutional: She appears well-developed. No distress.  HENT:  Head: Normocephalic.  Right Ear: External ear normal.  Left Ear: External ear normal.  Nose: Nose normal.  Mouth/Throat: Oropharynx is clear and moist.  Eyes: Conjunctivae are normal. Pupils are equal, round, and reactive to light. Right eye exhibits no discharge. Left eye exhibits no discharge.  Neck: Normal range of motion. Neck supple. No JVD present. No tracheal deviation present. No thyromegaly present.  Cardiovascular: Normal rate, regular rhythm and normal heart sounds.   Pulmonary/Chest: No stridor. No respiratory distress. She has no wheezes.  Abdominal: Soft. Bowel sounds are normal. She exhibits no distension and no mass. There is no  tenderness. There is no rebound and no guarding.  Musculoskeletal: She exhibits no edema or tenderness.  Lymphadenopathy:    She has no cervical adenopathy.  Neurological: She displays normal reflexes. No cranial nerve deficit. She exhibits normal muscle tone. Coordination normal.  Skin: No rash noted.  Psychiatric: She has a normal mood and affect. Her behavior is normal. Judgment and thought content normal.  acne/rosacea      Lab Results  Component Value Date   WBC 7.5 06/19/2014   HGB 13.8 06/19/2014   HCT 39.5 06/19/2014   PLT 357.0 06/19/2014   GLUCOSE 247* 06/19/2014   ALT 40* 06/19/2014   AST 29 06/19/2014   NA 136 06/19/2014   K 3.6 06/19/2014   CL 98 06/19/2014   CREATININE 0.80 06/19/2014   BUN 12 06/19/2014   CO2 26 06/19/2014   TSH 1.97 06/19/2014   INR 0.9 04/11/2007   HGBA1C 11.8* 06/19/2014        Assessment & Plan:

## 2014-07-24 NOTE — Assessment & Plan Note (Signed)
Better  

## 2014-07-28 NOTE — Assessment & Plan Note (Signed)
Chronic  BP is OK at home HCTZ, Micardis

## 2014-07-29 ENCOUNTER — Telehealth: Payer: Self-pay

## 2014-07-29 NOTE — Telephone Encounter (Signed)
Rec'd fax from [facility] requesting PA for Glyxambi 25 mg -5mg . Call placed to [employer] for PA; at [phone] and spoke with [facility] representative. APPROVED Rec'd auth for medication approved to Sept 7th, 2016; auth number is [id number]  Pharmacy informed.

## 2014-08-05 LAB — HM DIABETES EYE EXAM

## 2014-08-10 ENCOUNTER — Other Ambulatory Visit: Payer: Self-pay | Admitting: Internal Medicine

## 2014-08-16 ENCOUNTER — Ambulatory Visit: Payer: Managed Care, Other (non HMO) | Admitting: Internal Medicine

## 2014-09-03 ENCOUNTER — Encounter: Payer: Self-pay | Admitting: Internal Medicine

## 2014-09-05 ENCOUNTER — Ambulatory Visit (INDEPENDENT_AMBULATORY_CARE_PROVIDER_SITE_OTHER): Payer: Managed Care, Other (non HMO) | Admitting: Internal Medicine

## 2014-09-05 ENCOUNTER — Encounter: Payer: Self-pay | Admitting: Internal Medicine

## 2014-09-05 VITALS — BP 128/88 | HR 99 | Temp 97.9°F | Ht 63.75 in | Wt 172.0 lb

## 2014-09-05 DIAGNOSIS — E119 Type 2 diabetes mellitus without complications: Secondary | ICD-10-CM | POA: Diagnosis not present

## 2014-09-05 LAB — BASIC METABOLIC PANEL
BUN: 16 mg/dL (ref 6–23)
CO2: 31 mEq/L (ref 19–32)
Calcium: 9.9 mg/dL (ref 8.4–10.5)
Chloride: 98 mEq/L (ref 96–112)
Creatinine, Ser: 0.87 mg/dL (ref 0.40–1.20)
GFR: 72.14 mL/min (ref >60.00)
Glucose, Bld: 135 mg/dL — ABNORMAL HIGH (ref 70–99)
Potassium: 3.2 mEq/L — ABNORMAL LOW (ref 3.5–5.1)
Sodium: 137 mEq/L (ref 135–145)

## 2014-09-05 LAB — LIPID PANEL
Cholesterol: 229 mg/dL — ABNORMAL HIGH (ref 0–200)
HDL: 42.9 mg/dL (ref >39.00)
LDL Cholesterol: 156 mg/dL — ABNORMAL HIGH (ref 0–99)
NonHDL: 186.1
Total CHOL/HDL Ratio: 5
Triglycerides: 151 mg/dL — ABNORMAL HIGH (ref 0.0–149.0)
VLDL: 30.2 mg/dL (ref 0.0–40.0)

## 2014-09-05 LAB — HEMOGLOBIN A1C: Hgb A1c MFr Bld: 8.3 % — ABNORMAL HIGH (ref 4.6–6.5)

## 2014-09-05 NOTE — Progress Notes (Addendum)
Patient ID: Elizabeth Ware, female   DOB: [birth year], 54 y.o.   MRN: [id number]  HPI: Elizabeth Ware is a 54 y.o.-year-old female, referred by her PCP, Dr. Posey Rea, for management of DM2, prev GDM, dx in 05/2014, non-insulin-dependent, uncontrolled, without complications.   Last hemoglobin A1c was: Lab Results  Component Value Date   HGBA1C 11.8* 06/19/2014   Pt is on a regimen of: - Glyxambi 25-5 at night! Tried Metformin >> nausea.  Pt checks her sugars 0-1x a day and they are: - am: 140-160 (previously 190-220 before starting med) - 2h after b'fast: n/c - before lunch: n/c - 2h after lunch: n/c - before dinner: n/c - 2h after dinner: n/c - bedtime: n/c - nighttime: n/c No lows. Lowest sugar was 140; ? hypoglycemia awareness.  Highest sugar was 220.  Glucometer: One Touch Verio  Pt's meals are: - Breakfast: 2 Eggos - Lunch: pita tuna - Dinner: meat + veggies + spaghetti - Snacks: dill pickles, nuts, chocolate, potato chips Stopped sweet tea, when dx'ed.  She exercises - started 4 years ago.  - no CKD, last BUN/creatinine:  Lab Results  Component Value Date   BUN 12 06/19/2014   CREATININE 0.80 06/19/2014  On Micardis. - last set of lipids: No results found for: CHOL, HDL, LDLCALC, LDLDIRECT, TRIG, CHOLHDL - last eye exam was in 08/05/2014 (Dr. Nile Riggs). No DR.  - no numbness and tingling in her feet.  Pt has FH of DM2 in mother.  ROS: Constitutional: + weight loss, + fatigue, no subjective hyperthermia/hypothermia, + poor sleep, + excessive urination Eyes: no blurry vision, no xerophthalmia ENT: no sore throat, no nodules palpated in throat, no dysphagia/odynophagia, no hoarseness Cardiovascular: no CP/SOB/palpitations/leg swelling Respiratory: no cough/SOB Gastrointestinal: no N/V/D/C, + heartburn Musculoskeletal: no muscle/joint aches Skin: no rashes, + hair loss Neurological: no tremors/numbness/tingling/dizziness Psychiatric: no depression/+  anxiety  Past Medical History  Diagnosis Date  . Diabetes mellitus without complication    No past surgical history on file. History   Social History  . Marital Status: Married    Spouse Name: N/A  . Number of Children: 1   Occupational History  .    Social History Main Topics  . Smoking status: Never Smoker   . Smokeless tobacco: Not on file  . Alcohol Use: No  . Drug Use: No   Current Outpatient Prescriptions on File Prior to Visit  Medication Sig Dispense Refill  . Beclomethasone Dipropionate (QNASL) 80 MCG/ACT AERS Place 1 Act into the nose daily. 1 Inhaler 6  . clindamycin (CLEOCIN T) 1 % lotion APPLY TO AFFECTED AREA TWICE A DAY ON FACE 60 mL 3  . doxycycline (VIBRA-TABS) 100 MG tablet Take 1 tablet (100 mg total) by mouth 2 (two) times daily. 20 tablet 0  . Empagliflozin-Linagliptin (GLYXAMBI) 25-5 MG TABS Take 1 tablet by mouth every morning. 30 tablet 11  . erythromycin ophthalmic ointment Place 1 application into both eyes at bedtime. 3.5 g 0  . esomeprazole (NEXIUM) 40 MG capsule Take 40 mg by mouth as needed.    . fluticasone (FLONASE) 50 MCG/ACT nasal spray Place into both nostrils daily.    Marland Kitchen glucose blood (ONETOUCH VERIO) test strip Use as instructed 50 each 11  . glucose blood (ONETOUCH VERIO) test strip Use as instructed 50 each 11  . hydrochlorothiazide (HYDRODIURIL) 25 MG tablet TAKE 1 TABLET BY MOUTH EVERY DAY 90 tablet 5  . loratadine (CLARITIN) 10 MG tablet Take 1 tablet (10 mg total) by  mouth daily. 100 tablet 3  . Mometasone Furo-Formoterol Fum (DULERA) 200-5 MCG/ACT AERO Inhale 1 puff into the lungs 2 (two) times daily. 1 Inhaler 5  . ONETOUCH DELICA LANCETS FINE MISC 1 Device by Does not apply route daily as needed. 50 each 3  . pantoprazole (PROTONIX) 40 MG tablet Take 40 mg by mouth as needed.    Marland Kitchen telmisartan (MICARDIS) 80 MG tablet Take 1 tablet (80 mg total) by mouth daily. 30 tablet 11  . TRANXENE-T 7.5 MG tablet Take 1 tablet (7.5 mg total) by  mouth 2 (two) times daily as needed for anxiety or sleep. 60 tablet 1  . triamcinolone cream (KENALOG) 0.5 % Apply 1 application topically 3 (three) times daily. prn 90 g 3   No current facility-administered medications on file prior to visit.   Allergies  Allergen Reactions  . Cetirizine Hcl     REACTION: fatigue  . Metformin And Related Nausea And Vomiting  . Penicillins   . Valsartan     REACTION: rash   Family History  Problem Relation Age of Onset  . Diabetes Mother   . Stroke Mother   Thyroid ds. And HTN in Mother.  PE: BP 128/88 mmHg  Pulse 99  Temp(Src) 97.9 F (36.6 C) (Oral)  Ht 5' 3.75" (1.619 m)  Wt 172 lb (78.019 kg)  BMI 29.77 kg/m2  SpO2 94% Wt Readings from Last 3 Encounters:  09/05/14 172 lb (78.019 kg)  07/24/14 176 lb (79.833 kg)  06/19/14 182 lb (82.555 kg)   Constitutional: overweight, in NAD Eyes: PERRLA, EOMI, no exophthalmos ENT: moist mucous membranes, no thyromegaly, no cervical lymphadenopathy Cardiovascular: Tachycardia, regular rhythm, No MRG Respiratory: CTA B Gastrointestinal: abdomen soft, NT, ND, BS+ Musculoskeletal: no deformities, strength intact in all 4 Skin: moist, warm, no rashes Neurological: no tremor with outstretched hands, DTR normal in all 4  ASSESSMENT: 1. DM2, non-insulin-dependent, uncontrolled, without complications  PLAN:  1. Patient with a 2.5 month dx of diabetes, recently started on oral antidiabetic regimen with a DPP4 inhibitor + a SGLT2 R blocker. This appears to be working well, as her fasting sugars drop significantly. However, she is taking the medication incorrectly, at night, rather than in the morning. I advised her to move it in the morning. Since she had a significant improvement in her sugars after she started this medication, and we don't have sugars later in the day, or a recent HbA1c, I will continue her on this medication for now, taken correctly, in the morning, and I'll see the patient back in a  month to decide if we need to intensify the regimen. I would like to check another A1c today, even though is not quite 3 months since her previous one, to confirm a diagnosis of diabetes. We discussed at length about diet and ways to improve it. I gave her specific examples of healthier meals. She also agrees to be referred to nutrition. - I suggested to:  Patient Instructions  Please continue Glyxambi but move it to before breakfast.  Please stop at the lab.  Please schedule an appt with Oran Rein with nutrition.  Please return in 1 month with your sugar log.   - Strongly advised her to start checking sugars at different times of the day - check 2 times a day, rotating checks - given sugar log and advised how to fill it and to bring it at next appt  - given foot care handout and explained the principles  - given  instructions for hypoglycemia management "15-15 rule"  - advised for yearly eye exams - Since she recently started on Empagliflozin, will check a BMP today. I will add a cholesterol level, however she is nonfasting. - Return to clinic in 1 mo with sugar log   Office Visit on 09/05/2014  Component Date Value Ref Range Status  . HM Diabetic Eye Exam 08/05/2014 No Retinopathy  No Retinopathy Final  . Hgb A1c MFr Bld 09/05/2014 8.3* 4.6 - 6.5 % Final   Glycemic Control Guidelines for People with Diabetes:Non Diabetic:  <6%Goal of Therapy: <7%Additional Action Suggested:  >8%   . Cholesterol 09/05/2014 229* 0 - 200 mg/dL Final   ATP III Classification       Desirable:  < 200 mg/dL               Borderline High:  200 - 239 mg/dL          High:  > = 161240 mg/dL  . Triglycerides 09/05/2014 151.0* 0.0 - 149.0 mg/dL Final   Normal:  <096<150 mg/dLBorderline High:  150 - 199 mg/dL  . HDL 09/05/2014 42.90  >39.00 mg/dL Final  . VLDL 04/54/098104/14/2016 30.2  0.0 - 40.0 mg/dL Final  . LDL Cholesterol 09/05/2014 156* 0 - 99 mg/dL Final  . Total CHOL/HDL Ratio 09/05/2014 5   Final                  Men           Women1/2 Average Risk     3.4          3.3Average Risk          5.0          4.42X Average Risk          9.6          7.13X Average Risk          15.0          11.0                      . NonHDL 09/05/2014 186.10   Final   NOTE:  Non-HDL goal should be 30 mg/dL higher than patient's LDL goal (i.e. LDL goal of < 70 mg/dL, would have non-HDL goal of < 100 mg/dL)  . Sodium 09/05/2014 137  135 - 145 mEq/L Final  . Potassium 09/05/2014 3.2* 3.5 - 5.1 mEq/L Final  . Chloride 09/05/2014 98  96 - 112 mEq/L Final  . CO2 09/05/2014 31  19 - 32 mEq/L Final  . Glucose, Bld 09/05/2014 135* 70 - 99 mg/dL Final  . BUN 19/14/782904/14/2016 16  6 - 23 mg/dL Final  . Creatinine, Ser 09/05/2014 0.87  0.40 - 1.20 mg/dL Final  . Calcium 56/21/308604/14/2016 9.9  8.4 - 10.5 mg/dL Final  . GFR 57/84/696204/14/2016 72.14  >60.00 mL/min Final   LDL is 156, which would probably improve with controlling her diabetes and improving her diet. However she is likely a candidate for a statin. Potassium is a little low >> will advise to increase the potassium in her diet.

## 2014-09-05 NOTE — Patient Instructions (Signed)
Please continue Glyxambi but move it to before breakfast.  Please stop at the lab.  Please schedule an appt with Oran ReinLaura Jobe with nutrition.  Please return in 1 month with your sugar log.   PATIENT INSTRUCTIONS FOR TYPE 2 DIABETES:  **Please join MyChart!** - see attached instructions about how to join if you have not done so already.  DIET AND EXERCISE Diet and exercise is an important part of diabetic treatment.  We recommended aerobic exercise in the form of brisk walking (working between 40-60% of maximal aerobic capacity, similar to brisk walking) for 150 minutes per week (such as 30 minutes five days per week) along with 3 times per week performing 'resistance' training (using various gauge rubber tubes with handles) 5-10 exercises involving the major muscle groups (upper body, lower body and core) performing 10-15 repetitions (or near fatigue) each exercise. Start at half the above goal but build slowly to reach the above goals. If limited by weight, joint pain, or disability, we recommend daily walking in a swimming pool with water up to waist to reduce pressure from joints while allow for adequate exercise.    BLOOD GLUCOSES Monitoring your blood glucoses is important for continued management of your diabetes. Please check your blood glucoses 2-4 times a day: fasting, before meals and at bedtime (you can rotate these measurements - e.g. one day check before the 3 meals, the next day check before 2 of the meals and before bedtime, etc.).   HYPOGLYCEMIA (low blood sugar) Hypoglycemia is usually a reaction to not eating, exercising, or taking too much insulin/ other diabetes drugs.  Symptoms include tremors, sweating, hunger, confusion, headache, etc. Treat IMMEDIATELY with 15 grams of Carbs: . 4 glucose tablets .  cup regular juice/soda . 2 tablespoons raisins . 4 teaspoons sugar . 1 tablespoon honey Recheck blood glucose in 15 mins and repeat above if still symptomatic/blood  glucose <100.  RECOMMENDATIONS TO REDUCE YOUR RISK OF DIABETIC COMPLICATIONS: * Take your prescribed MEDICATION(S) * Follow a DIABETIC diet: Complex carbs, fiber rich foods, (monounsaturated and polyunsaturated) fats * AVOID saturated/trans fats, high fat foods, >2,300 mg salt per day. * EXERCISE at least 5 times a week for 30 minutes or preferably daily.  * DO NOT SMOKE OR DRINK more than 1 drink a day. * Check your FEET every day. Do not wear tightfitting shoes. Contact us if you develop an ulcer * See your EYE doctor once a year or more if needed * Get a FLU shot once a year * Get a PNEUMONIA vaccine once before and once after age 54 years  GOALS:  * Your Hemoglobin A1c of <7%  * fasting sugars need to be <130 * after meals sugars need to be <180 (2h after you start eating) * Your Systolic BP should be 140 or lower  * Your Diastolic BP should be 80 or lower  * Your HDL (Good Cholesterol) should be 40 or higher  * Your LDL (Bad Cholesterol) should be 100 or lower. * Your Triglycerides should be 150 or lower  * Your Urine microalbumin (kidney function) should be <30 * Your Body Mass Index should be 25 or lower    Please consider the following ways to cut down carbs and fat and increase fiber and micronutrients in your diet: - substitute whole grain for white bread or pasta - substitute brown rice for white rice - substitute 90-calorie flat bread pieces for slices of bread when possible - substitute sweet potatoes or yams  for white potatoes - substitute humus for margarine - substitute tofu for cheese when possible - substitute almond or rice milk for regular milk (would not drink soy milk daily due to concern for soy estrogen influence on breast cancer risk) - substitute dark chocolate for other sweets when possible - substitute water - can add lemon or orange slices for taste - for diet sodas (artificial sweeteners will trick your body that you can eat sweets without getting  calories and will lead you to overeating and weight gain in the long run) - do not skip breakfast or other meals (this will slow down the metabolism and will result in more weight gain over time)  - can try smoothies made from fruit and almond/rice milk in am instead of regular breakfast - can also try old-fashioned (not instant) oatmeal made with almond/rice milk in am - order the dressing on the side when eating salad at a restaurant (pour less than half of the dressing on the salad) - eat as little meat as possible - can try juicing, but should not forget that juicing will get rid of the fiber, so would alternate with eating raw veg./fruits or drinking smoothies - use as little oil as possible, even when using olive oil - can dress a salad with a mix of balsamic vinegar and lemon juice, for e.g. - use agave nectar, stevia sugar, or regular sugar rather than artificial sweateners - steam or broil/roast veggies  - snack on veggies/fruit/nuts (unsalted, preferably) when possible, rather than processed foods - reduce or eliminate aspartame in diet (it is in diet sodas, chewing gum, etc) Read the labels!  Try to read Dr. Janene Harvey book: "Program for Reversing Diabetes" for other ideas for healthy eating.

## 2014-09-06 ENCOUNTER — Encounter: Payer: Self-pay | Admitting: *Deleted

## 2014-10-02 ENCOUNTER — Ambulatory Visit: Payer: Managed Care, Other (non HMO) | Admitting: Internal Medicine

## 2014-10-08 ENCOUNTER — Other Ambulatory Visit: Payer: Self-pay | Admitting: *Deleted

## 2014-10-08 MED ORDER — EMPAGLIFLOZIN-LINAGLIPTIN 25-5 MG PO TABS
1.0000 | ORAL_TABLET | Freq: Every morning | ORAL | Status: DC
Start: 2014-10-08 — End: 2015-10-12

## 2014-10-10 ENCOUNTER — Encounter: Payer: Managed Care, Other (non HMO) | Attending: Internal Medicine | Admitting: Dietician

## 2014-10-10 ENCOUNTER — Encounter: Payer: Self-pay | Admitting: Dietician

## 2014-10-10 VITALS — Ht 64.0 in | Wt 167.0 lb

## 2014-10-10 DIAGNOSIS — E119 Type 2 diabetes mellitus without complications: Secondary | ICD-10-CM | POA: Diagnosis present

## 2014-10-10 DIAGNOSIS — Z713 Dietary counseling and surveillance: Secondary | ICD-10-CM | POA: Diagnosis not present

## 2014-10-10 NOTE — Patient Instructions (Signed)
Exercise regularly.  This helps control control your blood sugar. Aim for 2-3 Carb Choices per meal (30-45 grams) +/- 1 either way  Aim for 0-15 Carbs per snack if hungry  Include protein in moderation with your meals and snacks Consider reading food labels for Total Carbohydrate and Fat Grams of foods Consider checking BG at alternate times per day as directed by MD

## 2014-10-10 NOTE — Progress Notes (Signed)
  Medical Nutrition Therapy:  Appt start time: 1630 end time:  1730.   Assessment:  Primary concerns today: Patient would like to learn how to eat better to control blood sugar.  Patient newly diagnosed with type 2 diabetes in January of this year.  Sh has a hx of GDM 22 years ago.  HgbA1C decreased from 11.8% 06/19/14 to 8.3% 09/05/14.  Patient has lost weight from 187 lbs in December to 167 lbs today.  She states that she was sick on Metformin and this was the cause of part of her weight loss.    Lives with husband.  Both shop and husband cooks.  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  She cares for mom and Saturdays are busy at mother's home.   Preferred Learning Style:   No preference indicated   Learning Readiness:   Ready  Change in progress  MEDICATIONS: see list to include Empagliflozin-Linagliptin   DIETARY INTAKE: Patient reports stopping eating sweets. 24-hr recall:  B ( AM): 2 eggo waffles, plain and coffee with 2% milk  Snk ( AM): none  L ( PM): [employer]:  Roast beef and broccoli, or [employer]:  Salad and soup or soup and 1/2 sandwich or Pita delite:  Tuna peta Snk ( PM): dill pickles, potato chips or pretzels D ( PM): Meat and fruit, occasional starch (sweet potato) or french fries Snk ( PM): none Beverages: coffee with 2% milk, Lipton peach unsweetened tea, diet coke, unsweetened tea  Usual physical activity: Zumba and body pump 2 X per week for 1 hour each.  Crossfit on occasion.  Part of a bowling league and uses 15 lb ball.  Estimated energy needs: 1400 calories 158 g carbohydrates 88 g protein 47 g fat  Progress Towards Goal(s):  In progress.   Nutritional Diagnosis:  NB-1.1 Food and nutrition-related knowledge deficit As related to diabetic diet.  As evidenced by newly diagnosed diabetes.    Intervention:  Nutrition .ndmm.  Exercise regularly.  This helps control control your blood sugar. Aim for 2-3 Carb Choices per meal (30-45 grams) +/- 1  either way  Aim for 0-15 Carbs per snack if hungry  Include protein in moderation with your meals and snacks Consider reading food labels for Total Carbohydrate and Fat Grams of foods Consider checking BG at alternate times per day as directed by MD   Teaching Method Utilized:  Visual Auditory Hands on  Handouts given during visit include:  Label reading  Snack list  Breakfast sheet  HgbA1C sheet  Barriers to learning/adherence to lifestyle change: none  Demonstrated degree of understanding via:  Teach Back   Monitoring/Evaluation:  Dietary intake, exercise, label reading, and body weight prn.

## 2014-10-29 ENCOUNTER — Ambulatory Visit (INDEPENDENT_AMBULATORY_CARE_PROVIDER_SITE_OTHER): Payer: Managed Care, Other (non HMO) | Admitting: Internal Medicine

## 2014-10-29 ENCOUNTER — Encounter: Payer: Self-pay | Admitting: Internal Medicine

## 2014-10-29 VITALS — BP 104/62 | HR 95 | Temp 98.0°F | Resp 12 | Wt 167.0 lb

## 2014-10-29 DIAGNOSIS — I10 Essential (primary) hypertension: Secondary | ICD-10-CM

## 2014-10-29 DIAGNOSIS — E119 Type 2 diabetes mellitus without complications: Secondary | ICD-10-CM

## 2014-10-29 MED ORDER — GLIPIZIDE ER 5 MG PO TB24: 5.0000 mg | ORAL_TABLET | Freq: Every day | ORAL | Status: DC

## 2014-10-29 MED ORDER — HYDROCHLOROTHIAZIDE 25 MG PO TABS: 12.5000 mg | ORAL_TABLET | Freq: Every day | ORAL | Status: DC

## 2014-10-29 NOTE — Patient Instructions (Addendum)
Please continue Glyxambi 25-5 mg daily before breakfast. Please start Glipizide XL 5 mg in am, before b'fast.  Please decrease the diuretic (Hydrochlorothiazide) to 1/2 tablet daily.  Please return in 3 months with your sugar log.

## 2014-10-29 NOTE — Progress Notes (Signed)
Patient ID: Elizabeth LatheLisa T Ware, female   DOB: 10-26-1960, 54 y.o.   MRN: [id number]  HPI: Elizabeth LatheLisa T Ware is a 54 y.o.-year-old female, returning for f/u for DM2, prev GDM, dx in 05/2014, non-insulin-dependent, uncontrolled, without complications. Last visit 2 mo ago.  Last hemoglobin A1c was: Lab Results  Component Value Date   HGBA1C 8.3* 09/05/2014   HGBA1C 11.8* 06/19/2014   Pt is on a regimen of: - Glyxambi 25-5 moved in am Tried Metformin >> nausea.  Pt checks her sugars 0-1x a day and they are: - am: 140-160 (previously 190-220 before starting med) >> 140-150 - 2h after b'fast: n/c  - before lunch: n/c - 2h after lunch: n/c >> 172-200 - before dinner: n/c - 2h after dinner: n/c - bedtime: n/c - nighttime: n/c No lows. Lowest sugar was 140; + hypoglycemia awareness - ? level Highest sugar was 220 >> 200.  Glucometer: One Touch Verio  Pt's meals are: - Breakfast: 2 Eggos + fruit - Lunch: pita tuna; Panera; yoghurt - Dinner: meat + veggies + spaghetti - Snacks: dill pickles, nuts, chocolate, potato chips, grapes Stopped sweet tea, when dx'ed.  She saw nutrition since last visit >> it helped.  She exercises - started 4 years ago.  - no CKD, last BUN/creatinine:  Lab Results  Component Value Date   BUN 16 09/05/2014   CREATININE 0.87 09/05/2014  On Micardis. - last set of lipids: Lab Results  Component Value Date   CHOL 229* 09/05/2014   HDL 42.90 09/05/2014   LDLCALC 156* 09/05/2014   TRIG 151.0* 09/05/2014   CHOLHDL 5 09/05/2014   - last eye exam was in 08/05/2014 (Dr. Nile RiggsShapiro). No DR.  - no numbness and tingling in her feet.  ROS: Constitutional: + weight loss, + fatigue, no subjective hyperthermia/hypothermia, + poor sleep Eyes: no blurry vision, no xerophthalmia ENT: no sore throat, no nodules palpated in throat, no dysphagia/odynophagia, no hoarseness Cardiovascular: no CP/SOB/palpitations/leg swelling Respiratory: no cough/SOB Gastrointestinal: no  N/V/D/C/heartburn Musculoskeletal: no muscle/joint aches Skin: no rashes Neurological: no tremors/numbness/tingling/dizziness  I reviewed pt's medications, allergies, PMH, social hx, family hx, and changes were documented in the history of present illness. Otherwise, unchanged from my initial visit note.  Past Medical History  Diagnosis Date  . Diabetes mellitus without complication    No past surgical history on file. History   Social History  . Marital Status: Married    Spouse Name: N/A  . Number of Children: 1   Occupational History  .    Social History Main Topics  . Smoking status: Never Smoker   . Smokeless tobacco: Not on file  . Alcohol Use: No  . Drug Use: No   Current Outpatient Prescriptions on File Prior to Visit  Medication Sig Dispense Refill  . Beclomethasone Dipropionate (QNASL) 80 MCG/ACT AERS Place 1 Act into the nose daily. 1 Inhaler 6  . clindamycin (CLEOCIN T) 1 % lotion APPLY TO AFFECTED AREA TWICE A DAY ON FACE 60 mL 3  . doxycycline (VIBRA-TABS) 100 MG tablet Take 1 tablet (100 mg total) by mouth 2 (two) times daily. 20 tablet 0  . Empagliflozin-Linagliptin (GLYXAMBI) 25-5 MG TABS Take 1 tablet by mouth every morning. 90 tablet 3  . erythromycin ophthalmic ointment Place 1 application into both eyes at bedtime. 3.5 g 0  . esomeprazole (NEXIUM) 40 MG capsule Take 40 mg by mouth as needed.    . fluticasone (FLONASE) 50 MCG/ACT nasal spray Place into both nostrils daily.    .Marland Kitchen  glucose blood (ONETOUCH VERIO) test strip Use as instructed 50 each 11  . glucose blood (ONETOUCH VERIO) test strip Use as instructed 50 each 11  . hydrochlorothiazide (HYDRODIURIL) 25 MG tablet TAKE 1 TABLET BY MOUTH EVERY DAY 90 tablet 5  . Mometasone Furo-Formoterol Fum (DULERA) 200-5 MCG/ACT AERO Inhale 1 puff into the lungs 2 (two) times daily. 1 Inhaler 5  . ONETOUCH DELICA LANCETS FINE MISC 1 Device by Does not apply route daily as needed. 50 each 3  . pantoprazole  (PROTONIX) 40 MG tablet Take 40 mg by mouth as needed.    Marland Kitchen telmisartan (MICARDIS) 80 MG tablet Take 1 tablet (80 mg total) by mouth daily. 30 tablet 11  . TRANXENE-T 7.5 MG tablet Take 1 tablet (7.5 mg total) by mouth 2 (two) times daily as needed for anxiety or sleep. 60 tablet 1  . triamcinolone cream (KENALOG) 0.5 % Apply 1 application topically 3 (three) times daily. prn 90 g 3  . loratadine (CLARITIN) 10 MG tablet Take 1 tablet (10 mg total) by mouth daily. 100 tablet 3   No current facility-administered medications on file prior to visit.   Allergies  Allergen Reactions  . Cetirizine Hcl     REACTION: fatigue  . Metformin And Related Nausea And Vomiting  . Penicillins   . Valsartan     REACTION: rash   Family History  Problem Relation Age of Onset  . Diabetes Mother   . Stroke Mother   Thyroid ds. And HTN in Mother.  PE: BP 104/62 mmHg  Pulse 95  Temp(Src) 98 F (36.7 C) (Oral)  Resp 12  Wt 167 lb (75.751 kg)  SpO2 95% Body mass index is 28.65 kg/(m^2).  Wt Readings from Last 3 Encounters:  10/29/14 167 lb (75.751 kg)  10/10/14 167 lb (75.751 kg)  09/05/14 172 lb (78.019 kg)   Constitutional: overweight, in NAD Eyes: PERRLA, EOMI, no exophthalmos ENT: moist mucous membranes, no thyromegaly, no cervical lymphadenopathy Cardiovascular: Tachycardia, regular rhythm, No MRG Respiratory: CTA B Gastrointestinal: abdomen soft, NT, ND, BS+ Musculoskeletal: no deformities, strength intact in all 4 Skin: moist, warm, no rashes Neurological: no tremor with outstretched hands, DTR normal in all 4  ASSESSMENT: 1. DM2, non-insulin-dependent, uncontrolled, without complications  2. HTN  PLAN:  1. Patient with a recent dx of diabetes, on oral antidiabetic regimen with a DPP4 inhibitor + a SGLT2 R blocker. This appears to be working well, HbA1c much better at last check, but sugars still higher after meals >> start Glipizide XL 5 mg daily. - I suggested to:  Patient  Instructions  Please continue Glyxambi 25-5 mg daily before breakfast. Please start Glipizide XL 5 mg in am, before b'fast.  Please decrease the diuretic (Hydrochlorothiazide) to 1/2 tablet daily.  Please return in 2 months with your sugar log.   - continue checking sugars at different times of the day - check 2 times a day, rotating checks - advised for yearly eye exams >> she is UTD - Return to clinic in 2 mo with sugar log   2. HTN - BP great here, but in the 90s/60s at home. She can get dizzy, she mentions >> will decrease HCTZ to 12.5 mg daily and advised her to check sugars at home and let me know if BP still low. Since on Farxiga, she may not even need HCTZ.

## 2014-12-03 ENCOUNTER — Other Ambulatory Visit: Payer: Self-pay | Admitting: *Deleted

## 2014-12-03 MED ORDER — GLIPIZIDE ER 5 MG PO TB24: 5.0000 mg | ORAL_TABLET | Freq: Every day | ORAL | Status: DC

## 2014-12-25 ENCOUNTER — Other Ambulatory Visit: Payer: Self-pay | Admitting: *Deleted

## 2014-12-25 MED ORDER — GLIPIZIDE ER 5 MG PO TB24: 5.0000 mg | ORAL_TABLET | Freq: Every day | ORAL | Status: DC

## 2014-12-25 NOTE — Telephone Encounter (Signed)
Pt requesting 90 day supply, for ins. costs.

## 2014-12-30 ENCOUNTER — Other Ambulatory Visit: Payer: Self-pay | Admitting: *Deleted

## 2014-12-30 MED ORDER — GLIPIZIDE ER 5 MG PO TB24: 5.0000 mg | ORAL_TABLET | Freq: Every day | ORAL | Status: DC

## 2014-12-30 NOTE — Telephone Encounter (Signed)
Resending rx refill for glipizide. It did not go through escript.

## 2015-01-06 ENCOUNTER — Encounter (HOSPITAL_COMMUNITY): Payer: Self-pay

## 2015-01-06 ENCOUNTER — Ambulatory Visit: Payer: Managed Care, Other (non HMO) | Admitting: Internal Medicine

## 2015-01-06 ENCOUNTER — Emergency Department (HOSPITAL_COMMUNITY)
Admission: EM | Admit: 2015-01-06 | Discharge: 2015-01-06 | Disposition: A | Discharge status: Home / Self Care | Payer: Managed Care, Other (non HMO) | Attending: Emergency Medicine | Admitting: Emergency Medicine

## 2015-01-06 DIAGNOSIS — Z880 Allergy status to penicillin: Secondary | ICD-10-CM | POA: Insufficient documentation

## 2015-01-06 DIAGNOSIS — R42 Dizziness and giddiness: Secondary | ICD-10-CM | POA: Diagnosis not present

## 2015-01-06 DIAGNOSIS — R112 Nausea with vomiting, unspecified: Secondary | ICD-10-CM | POA: Insufficient documentation

## 2015-01-06 DIAGNOSIS — R55 Syncope and collapse: Secondary | ICD-10-CM

## 2015-01-06 DIAGNOSIS — Z79899 Other long term (current) drug therapy: Secondary | ICD-10-CM | POA: Insufficient documentation

## 2015-01-06 DIAGNOSIS — Z8719 Personal history of other diseases of the digestive system: Secondary | ICD-10-CM | POA: Diagnosis not present

## 2015-01-06 DIAGNOSIS — I10 Essential (primary) hypertension: Secondary | ICD-10-CM | POA: Insufficient documentation

## 2015-01-06 DIAGNOSIS — E119 Type 2 diabetes mellitus without complications: Secondary | ICD-10-CM | POA: Diagnosis not present

## 2015-01-06 DIAGNOSIS — R569 Unspecified convulsions: Secondary | ICD-10-CM | POA: Diagnosis present

## 2015-01-06 HISTORY — DX: Essential (primary) hypertension: I10

## 2015-01-06 HISTORY — DX: Diverticulitis of intestine, part unspecified, without perforation or abscess without bleeding: K57.92

## 2015-01-06 LAB — CBC
HEMATOCRIT: 38.6 % (ref 36.0–46.0)
HEMOGLOBIN: 12.8 g/dL (ref 12.0–15.0)
MCH: 28.8 pg (ref 26.0–34.0)
MCHC: 33.2 g/dL (ref 30.0–36.0)
MCV: 86.7 fL (ref 78.0–100.0)
Platelets: 314 10*3/uL (ref 150–400)
RBC: 4.45 MIL/uL (ref 3.87–5.11)
RDW: 12.8 % (ref 11.5–15.5)
WBC: 9.9 10*3/uL (ref 4.0–10.5)

## 2015-01-06 LAB — BASIC METABOLIC PANEL
Anion gap: 12 (ref 5–15)
BUN: 13 mg/dL (ref 6–20)
CALCIUM: 9.1 mg/dL (ref 8.9–10.3)
CHLORIDE: 103 mmol/L (ref 101–111)
CO2: 25 mmol/L (ref 22–32)
CREATININE: 0.93 mg/dL (ref 0.44–1.00)
GFR calc non Af Amer: >60 mL/min (ref >60)
Glucose, Bld: 117 mg/dL — ABNORMAL HIGH (ref 65–99)
Potassium: 3.3 mmol/L — ABNORMAL LOW (ref 3.5–5.1)
SODIUM: 140 mmol/L (ref 135–145)

## 2015-01-06 LAB — CBG MONITORING, ED: Glucose-Capillary: 115 mg/dL — ABNORMAL HIGH (ref 65–99)

## 2015-01-06 NOTE — ED Provider Notes (Signed)
CSN: [id number]     Arrival date & time 01/06/15  0423 History   First MD Initiated Contact with Patient 01/06/15 [phone]     Chief Complaint  Patient presents with  . Seizures     (Consider location/radiation/quality/duration/timing/severity/associated sxs/prior Treatment) HPI Comments: Pt comes in post syncope. She has no cardiac hx, no seizure hx. Was here with his mother and was watching staples being placed. She thought she was doing well, but suddenly started feeling dizzy, she stepped out of the room, asked for something to drink - but then fainted. Pt had no seizure like activity. Pt states that she threw up afterwards as well. She denies any chest pain, palpitations, dyspnea prior to the episode.   Patient is a 54 y.o. female presenting with seizures. The history is provided by the patient.  Seizures   Past Medical History  Diagnosis Date  . Diabetes mellitus without complication   . Hypertension   . Diverticulitis    Past Surgical History  Procedure Laterality Date  . Colon surgery     Family History  Problem Relation Age of Onset  . Diabetes Mother   . Stroke Mother    Social History  Substance Use Topics  . Smoking status: Never Smoker   . Smokeless tobacco: None  . Alcohol Use: No   OB History    No data available     Review of Systems  Constitutional: Negative for activity change.  Respiratory: Negative for shortness of breath.   Cardiovascular: Negative for chest pain.  Gastrointestinal: Positive for nausea and vomiting. Negative for abdominal pain.  Genitourinary: Negative for dysuria.  Musculoskeletal: Negative for neck pain.  Neurological: Positive for syncope and light-headedness. Negative for seizures and headaches.  All other systems reviewed and are negative.     Allergies  Cetirizine hcl; Metformin and related; Penicillins; and Valsartan  Home Medications   Prior to Admission medications   Medication Sig Start Date End Date Taking?  Authorizing Provider  Empagliflozin-Linagliptin (GLYXAMBI) 25-5 MG TABS Take 1 tablet by mouth every morning. 10/08/14  Yes Aleksei Plotnikov V, MD  glipiZIDE (GLUCOTROL XL) 5 MG 24 hr tablet Take 1 tablet (5 mg total) by mouth daily with breakfast. 12/30/14  Yes Carlus Pavlov, MD  hydrochlorothiazide (HYDRODIURIL) 25 MG tablet Take 0.5 tablets (12.5 mg total) by mouth daily. Patient taking differently: Take 25 mg by mouth daily.  10/29/14  Yes Carlus Pavlov, MD  telmisartan (MICARDIS) 80 MG tablet Take 1 tablet (80 mg total) by mouth daily. 12/27/13  Yes Tresa Garter, MD  Beclomethasone Dipropionate (QNASL) 80 MCG/ACT AERS Place 1 Act into the nose daily. Patient not taking: Reported on 01/06/2015 01/26/12   Jacinta Shoe V, MD  clindamycin (CLEOCIN T) 1 % lotion APPLY TO AFFECTED AREA TWICE A DAY ON FACE Patient not taking: Reported on 01/06/2015 03/26/14   Jacinta Shoe V, MD  doxycycline (VIBRA-TABS) 100 MG tablet Take 1 tablet (100 mg total) by mouth 2 (two) times daily. Patient not taking: Reported on 01/06/2015 06/27/14   Jacinta Shoe V, MD  erythromycin ophthalmic ointment Place 1 application into both eyes at bedtime. Patient not taking: Reported on 01/06/2015 10/08/13   Aleksei Plotnikov V, MD  glucose blood (ONETOUCH VERIO) test strip Use as instructed 07/24/14   Macarthur Critchley Plotnikov V, MD  glucose blood (ONETOUCH VERIO) test strip Use as instructed 07/24/14   Aleksei Plotnikov V, MD  loratadine (CLARITIN) 10 MG tablet Take 1 tablet (10 mg total) by mouth daily.  10/08/13 10/08/14  Aleksei Plotnikov V, MD  Mometasone Furo-Formoterol Fum (DULERA) 200-5 MCG/ACT AERO Inhale 1 puff into the lungs 2 (two) times daily. Patient not taking: Reported on 01/06/2015 01/26/12   Tresa Garter, MD  [facility] DELICA LANCETS FINE MISC 1 Device by Does not apply route daily as needed. 06/19/14   Aleksei Plotnikov V, MD  TRANXENE-T 7.5 MG tablet Take 1 tablet (7.5 mg total) by mouth 2 (two) times daily  as needed for anxiety or sleep. Patient not taking: Reported on 01/06/2015 12/31/13   Jacinta Shoe V, MD  triamcinolone cream (KENALOG) 0.5 % Apply 1 application topically 3 (three) times daily. prn Patient not taking: Reported on 01/06/2015 10/08/13   Aleksei Plotnikov V, MD   BP 100/62 mmHg  Pulse 89  Temp(Src) 97.9 F (36.6 C) (Oral)  Resp 15  Ht 5\' 4"  (1.626 m)  Wt 166 lb (75.297 kg)  BMI 28.48 kg/m2  SpO2 97% Physical Exam  Constitutional: She is oriented to person, place, and time. She appears well-developed.  HENT:  Head: Normocephalic and atraumatic.  Eyes: Conjunctivae and EOM are normal. Pupils are equal, round, and reactive to light.  Neck: Normal range of motion. Neck supple.  Cardiovascular: Normal rate, regular rhythm, normal heart sounds and intact distal pulses.   No murmur heard. Pulmonary/Chest: Effort normal. No respiratory distress. She has no wheezes.  Abdominal: Soft. Bowel sounds are normal. She exhibits no distension. There is no tenderness. There is no rebound and no guarding.  Neurological: She is alert and oriented to person, place, and time. No cranial nerve deficit. Coordination normal.  Skin: Skin is warm and dry.    ED Course  Procedures (including critical care time) Labs Review Labs Reviewed  BASIC METABOLIC PANEL - Abnormal; Notable for the following:    Potassium 3.3 (*)    Glucose, Bld 117 (*)    All other components within normal limits  CBG MONITORING, ED - Abnormal; Notable for the following:    Glucose-Capillary 115 (*)    All other components within normal limits  CBC      EKG Interpretation None        Date: 01/06/2015  Rate: 76  Rhythm: RBBB  QRS Axis: normal  Intervals: normal  ST/T Wave abnormalities: normal  Conduction Disutrbances: none  Narrative Interpretation: unremarkable      MDM   Final diagnoses:  Vasovagal syncope    Pt comes in with syncope. Appears to have had vasovagal event. EKG shows RBBB  - but it appears that she had incomplete RBBB before. Will monitor for 2 hours in the ER, if no acute findings, will dc.    Derwood Kaplan, MD 01/06/15 [phone]

## 2015-01-06 NOTE — ED Notes (Signed)
CBG of 115 ?

## 2015-01-06 NOTE — ED Notes (Signed)
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 Is diabetic and has HTN. No other hx. Pt had a witness episode of shaking by RN and tech and then vomited. Pt is alert and oriented at this time.

## 2015-01-06 NOTE — Discharge Instructions (Signed)
Vasovagal Syncope, Adult °Syncope, commonly known as fainting, is a temporary loss of consciousness. It occurs when the blood flow to the brain is reduced. Vasovagal syncope (also called neurocardiogenic syncope) is a fainting spell in which the blood flow to the brain is reduced because of a sudden drop in heart rate and blood pressure. Vasovagal syncope occurs when the brain and the cardiovascular system (blood vessels) do not adequately communicate and respond to each other. This is the most common cause of fainting. It often occurs in response to fear or some other type of emotional or physical stress. The body has a reaction in which the heart starts beating too slowly or the blood vessels expand, reducing blood pressure. This type of fainting spell is generally considered harmless. However, injuries can occur if a person takes a sudden fall during a fainting spell.  °CAUSES  °Vasovagal syncope occurs when a person's blood pressure and heart rate decrease suddenly, usually in response to a trigger. Many things and situations can trigger an episode. Some of these include:  °· Pain.   °· Fear.   °· The sight of blood or medical procedures, such as blood being drawn from a vein.   °· Common activities, such as coughing, swallowing, stretching, or going to the bathroom.   °· Emotional stress.   °· Prolonged standing, especially in a warm environment.   °· Lack of sleep or rest.   °· Prolonged lack of food.   °· Prolonged lack of fluids.   °· Recent illness. °· The use of certain drugs that affect blood pressure, such as cocaine, alcohol, marijuana, inhalants, and opiates.   °SYMPTOMS  °Before the fainting episode, you may:  °· Feel dizzy or light headed.   °· Become pale. °· Sense that you are going to faint.   °· Feel like the room is spinning.   °· Have tunnel vision, only seeing directly in front of you.   °· Feel sick to your stomach (nauseous).   °· See spots or slowly lose vision.   °· Hear ringing in your  ears.   °· Have a headache.   °· Feel warm and sweaty.   °· Feel a sensation of pins and needles. °During the fainting spell, you will generally be unconscious for no longer than a couple minutes before waking up and returning to normal. If you get up too quickly before your body can recover, you may faint again. Some twitching or jerky movements may occur during the fainting spell.  °DIAGNOSIS  °Your caregiver will ask about your symptoms, take a medical history, and perform a physical exam. Various tests may be done to rule out other causes of fainting. These may include blood tests and tests to check the heart, such as electrocardiography, echocardiography, and possibly an electrophysiology study. When other causes have been ruled out, a test may be done to check the body's response to changes in position (tilt table test). °TREATMENT  °Most cases of vasovagal syncope do not require treatment. Your caregiver may recommend ways to avoid fainting triggers and may provide home strategies for preventing fainting. If you must be exposed to a possible trigger, you can drink additional fluids to help reduce your chances of having an episode of vasovagal syncope. If you have warning signs of an oncoming episode, you can respond by positioning yourself favorably (lying down). °If your fainting spells continue, you may be given medicines to prevent fainting. Some medicines may help make you more resistant to repeated episodes of vasovagal syncope. Special exercises or compression stockings may be recommended. In rare cases, the surgical placement   of a pacemaker is considered. °HOME CARE INSTRUCTIONS  °· Learn to identify the warning signs of vasovagal syncope.   °· Sit or lie down at the first warning sign of a fainting spell. If sitting, put your head down between your legs. If you lie down, swing your legs up in the air to increase blood flow to the brain.   °· Avoid hot tubs and saunas. °· Avoid prolonged  standing. °· Drink enough fluids to keep your urine clear or pale yellow. Avoid caffeine. °· Increase salt in your diet as directed by your caregiver.   °· If you have to stand for a long time, perform movements such as:   °¨ Crossing your legs.   °¨ Flexing and stretching your leg muscles.   °¨ Squatting.   °¨ Moving your legs.   °¨ Bending over.   °· Only take over-the-counter or prescription medicines as directed by your caregiver. Do not suddenly stop any medicines without asking your caregiver first.  °SEEK MEDICAL CARE IF:  °· Your fainting spells continue or happen more frequently in spite of treatment.   °· You lose consciousness for more than a couple minutes. °· You have fainting spells during or after exercising or after being startled.   °· You have new symptoms that occur with the fainting spells, such as:   °¨ Shortness of breath. °¨ Chest pain.   °¨ Irregular heartbeat.   °· You have episodes of twitching or jerky movements that last longer than a few seconds. °· You have episodes of twitching or jerky movements without obvious fainting. °SEEK IMMEDIATE MEDICAL CARE IF:  °· You have injuries or bleeding after a fainting spell.   °· You have episodes of twitching or jerky movements that last longer than 5 minutes.   °· You have more than one spell of twitching or jerky movements before returning to consciousness after fainting. °MAKE SURE YOU:  °· Understand these instructions. °· Will watch your condition. °· Will get help right away if you are not doing well or get worse. °Document Released: 04/26/2012 Document Reviewed: 04/26/2012 °[employer]® Patient Information ©2015 [employer]. This information is not intended to replace advice given to you by your health care provider. Make sure you discuss any questions you have with your health care provider. ° °

## 2015-01-06 NOTE — ED Notes (Signed)
Dr. nanavati at the bedside.  

## 2015-02-14 ENCOUNTER — Other Ambulatory Visit: Payer: Self-pay | Admitting: Obstetrics and Gynecology

## 2015-02-14 DIAGNOSIS — N6011 Diffuse cystic mastopathy of right breast: Secondary | ICD-10-CM

## 2015-02-18 ENCOUNTER — Encounter: Payer: Self-pay | Admitting: Internal Medicine

## 2015-02-18 ENCOUNTER — Ambulatory Visit (INDEPENDENT_AMBULATORY_CARE_PROVIDER_SITE_OTHER): Payer: Managed Care, Other (non HMO) | Admitting: Internal Medicine

## 2015-02-18 ENCOUNTER — Other Ambulatory Visit (INDEPENDENT_AMBULATORY_CARE_PROVIDER_SITE_OTHER): Payer: Managed Care, Other (non HMO) | Admitting: *Deleted

## 2015-02-18 VITALS — BP 104/68 | HR 92 | Temp 98.2°F | Resp 12 | Wt 170.0 lb

## 2015-02-18 DIAGNOSIS — E119 Type 2 diabetes mellitus without complications: Secondary | ICD-10-CM

## 2015-02-18 DIAGNOSIS — Z23 Encounter for immunization: Secondary | ICD-10-CM | POA: Diagnosis not present

## 2015-02-18 DIAGNOSIS — H6691 Otitis media, unspecified, right ear: Secondary | ICD-10-CM

## 2015-02-18 DIAGNOSIS — I10 Essential (primary) hypertension: Secondary | ICD-10-CM | POA: Diagnosis not present

## 2015-02-18 LAB — POCT GLYCOSYLATED HEMOGLOBIN (HGB A1C): Hemoglobin A1C: 6.3

## 2015-02-18 MED ORDER — DOXYCYCLINE HYCLATE 100 MG PO TABS: 100.0000 mg | ORAL_TABLET | Freq: Two times a day (BID) | ORAL | Status: DC

## 2015-02-18 NOTE — Progress Notes (Signed)
Patient ID: Elizabeth Ware, female   DOB: 1961/03/12, 54 y.o.   MRN: [id number]  HPI: Elizabeth Ware is a 54 y.o.-year-old female, returning for f/u for DM2, prev GDM, dx in 05/2014, non-insulin-dependent, uncontrolled, without complications. Last visit 3.5 mo ago.  Last hemoglobin A1c was: Lab Results  Component Value Date   HGBA1C 8.3* 09/05/2014   HGBA1C 11.8* 06/19/2014   Pt is on a regimen of: - Glyxambi 25-5 moved in am - Glipizide XL 5 mg in am, before b'fast. Tried Metformin before >> nausea.  Pt checks her sugars 0-1x a day and they are: - am: 140-160 (previously 190-220 before starting med) >> 140-150 >> 104-118 - 2h after b'fast: n/c  - before lunch: n/c - 2h after lunch: n/c >> 172-200 >> 160 - before dinner: n/c - 2h after dinner: n/c - bedtime: n/c - nighttime: n/c No lows. Lowest sugar was 140 >> 100; + hypoglycemia awareness - ? level Highest sugar was 220 >> 200 >> 160.  Glucometer: One Touch Verio  Pt's meals are: - Breakfast: 2 Eggos + fruit - Lunch: pita tuna; [employer]; yoghurt - Dinner: meat + veggies + spaghetti - Snacks: dill pickles, nuts, chocolate, potato chips, grapes Stopped sweet tea, when dx'ed. She saw nutrition since last visit >> it helped.  Exercises 2-4 x a week.  - no CKD, last BUN/creatinine:  Lab Results  Component Value Date   BUN 13 01/06/2015   CREATININE 0.93 01/06/2015  On Micardis. - last set of lipids: Lab Results  Component Value Date   CHOL 229* 09/05/2014   HDL 42.90 09/05/2014   LDLCALC 156* 09/05/2014   TRIG 151.0* 09/05/2014   CHOLHDL 5 09/05/2014   - last eye exam was in 08/05/2014 (Dr. Nile Riggs). No DR.  - no numbness and tingling in her feet.  ROS: Constitutional: no weight loss, no fatigue, no subjective hyperthermia/hypothermia Eyes: no blurry vision, no xerophthalmia ENT: no sore throat, no nodules palpated in throat, no dysphagia/odynophagia, no hoarseness Cardiovascular: no CP/SOB/palpitations/leg  swelling Respiratory: no cough/SOB Gastrointestinal: no N/V/D/C/heartburn Musculoskeletal: no muscle/joint aches Skin: no rashes Neurological: no tremors/numbness/tingling/dizziness  I reviewed pt's medications, allergies, PMH, social hx, family hx, and changes were documented in the history of present illness. Otherwise, unchanged from my initial visit note.  Past Medical History  Diagnosis Date  . Diabetes mellitus without complication   . Hypertension   . Diverticulitis    Past Surgical History  Procedure Laterality Date  . Colon surgery     History   Social History  . Marital Status: Married    Spouse Name: N/A  . Number of Children: 1   Occupational History  .    Social History Main Topics  . Smoking status: Never Smoker   . Smokeless tobacco: Not on file  . Alcohol Use: No  . Drug Use: No   Current Outpatient Prescriptions on File Prior to Visit  Medication Sig Dispense Refill  . Beclomethasone Dipropionate (QNASL) 80 MCG/ACT AERS Place 1 Act into the nose daily. 1 Inhaler 6  . clindamycin (CLEOCIN T) 1 % lotion APPLY TO AFFECTED AREA TWICE A DAY ON FACE 60 mL 3  . doxycycline (VIBRA-TABS) 100 MG tablet Take 1 tablet (100 mg total) by mouth 2 (two) times daily. 20 tablet 0  . Empagliflozin-Linagliptin (GLYXAMBI) 25-5 MG TABS Take 1 tablet by mouth every morning. 90 tablet 3  . erythromycin ophthalmic ointment Place 1 application into both eyes at bedtime. 3.5 g 0  .  glipiZIDE (GLUCOTROL XL) 5 MG 24 hr tablet Take 1 tablet (5 mg total) by mouth daily with breakfast. 90 tablet 0  . glucose blood (ONETOUCH VERIO) test strip Use as instructed 50 each 11  . glucose blood (ONETOUCH VERIO) test strip Use as instructed 50 each 11  . hydrochlorothiazide (HYDRODIURIL) 25 MG tablet Take 0.5 tablets (12.5 mg total) by mouth daily. (Patient taking differently: Take 25 mg by mouth daily. ) 90 tablet 5  . Mometasone Furo-Formoterol Fum (DULERA) 200-5 MCG/ACT AERO Inhale 1 puff  into the lungs 2 (two) times daily. 1 Inhaler 5  . ONETOUCH DELICA LANCETS FINE MISC 1 Device by Does not apply route daily as needed. 50 each 3  . telmisartan (MICARDIS) 80 MG tablet Take 1 tablet (80 mg total) by mouth daily. (Patient taking differently: Take 40 mg by mouth daily. ) 30 tablet 11  . TRANXENE-T 7.5 MG tablet Take 1 tablet (7.5 mg total) by mouth 2 (two) times daily as needed for anxiety or sleep. 60 tablet 1  . triamcinolone cream (KENALOG) 0.5 % Apply 1 application topically 3 (three) times daily. prn 90 g 3  . loratadine (CLARITIN) 10 MG tablet Take 1 tablet (10 mg total) by mouth daily. 100 tablet 3   No current facility-administered medications on file prior to visit.   Allergies  Allergen Reactions  . Cetirizine Hcl     REACTION: fatigue  . Metformin And Related Nausea And Vomiting  . Penicillins Nausea And Vomiting  . Valsartan     REACTION: rash   Family History  Problem Relation Age of Onset  . Diabetes Mother   . Stroke Mother   Thyroid ds. And HTN in Mother.  PE: BP 104/68 mmHg  Pulse 92  Temp(Src) 98.2 F (36.8 C) (Oral)  Resp 12  Wt 170 lb (77.111 kg)  SpO2 95% Body mass index is 29.17 kg/(m^2).  Wt Readings from Last 3 Encounters:  02/18/15 170 lb (77.111 kg)  01/06/15 166 lb (75.297 kg)  10/29/14 167 lb (75.751 kg)   Constitutional: overweight, in NAD Eyes: PERRLA, EOMI, no exophthalmos ENT: moist mucous membranes, no thyromegaly, no cervical lymphadenopathy; R ear with postmembranic congestion. Cardiovascular: Tachycardia, regular rhythm, No MRG Respiratory: CTA B Gastrointestinal: abdomen soft, NT, ND, BS+ Musculoskeletal: no deformities, strength intact in all 4 Skin: moist, warm, no rashes Neurological: no tremor with outstretched hands, DTR normal in all 4  ASSESSMENT: 1. DM2, non-insulin-dependent, uncontrolled, without complications  2. HTN  3. Otitis media  PLAN:  1. Patient with a recent dx of diabetes, on oral  antidiabetic regimen with a DPP4 inhibitor + a SGLT2 R blocker and added Glipizide XL 5 mg daily at last visit. This appears to be working well >> sugars are great! - I suggested to:  Patient Instructions  Please continue Glyxambi 25-5 mg daily before breakfast. Continue Glipizide XL 5 mg in am, before b'fast.  Please decrease the diuretic (Hydrochlorothiazide) to 1/2 tablet daily. Continue Telmisartan 40 mg daily.  Please return in 3 months with your sugar log.   - continue checking sugars at different times of the day - check 2 times a day, rotating checks - advised for yearly eye exams >> she is UTD - will give flu shot today - checked HbA1c 6.3% (much better!) - Return to clinic in 3 mo with sugar log   2. HTN - BP low, 90s/60s at home. She can get dizzy, she mentions >> will try again to decrease HCTZ  to 12.5 mg daily. She tried to decrease before >> leg swelling especially that she was working out in the heat. Will retry to decrease the dose now that is cooler outside.  3. Otitis media - refilled Doxycycline

## 2015-02-18 NOTE — Patient Instructions (Signed)
Please continue Glyxambi 25-5 mg daily before breakfast. Continue Glipizide XL 5 mg in am, before b'fast.  Please decrease the diuretic (Hydrochlorothiazide) to 1/2 tablet daily. Continue Telmisartan 40 mg daily.  Please return in 3 months with your sugar log.

## 2015-03-03 ENCOUNTER — Ambulatory Visit
Admission: RE | Admit: 2015-03-03 | Discharge: 2015-03-03 | Disposition: A | Discharge status: Home / Self Care | Payer: Managed Care, Other (non HMO) | Source: Ambulatory Visit | Attending: Obstetrics and Gynecology | Admitting: Obstetrics and Gynecology

## 2015-03-03 DIAGNOSIS — N6011 Diffuse cystic mastopathy of right breast: Secondary | ICD-10-CM

## 2015-03-08 ENCOUNTER — Other Ambulatory Visit: Payer: Self-pay | Admitting: Internal Medicine

## 2015-04-22 ENCOUNTER — Telehealth: Payer: Self-pay

## 2015-04-22 NOTE — Telephone Encounter (Signed)
PA started via cover my meds. KEY: [id number]

## 2015-04-24 ENCOUNTER — Ambulatory Visit: Payer: Managed Care, Other (non HMO) | Admitting: Internal Medicine

## 2015-04-25 NOTE — Telephone Encounter (Signed)
Faxed form from cover my meds that indicated rx PA was not approved.

## 2015-06-23 ENCOUNTER — Ambulatory Visit (INDEPENDENT_AMBULATORY_CARE_PROVIDER_SITE_OTHER): Payer: Managed Care, Other (non HMO) | Admitting: Internal Medicine

## 2015-06-23 ENCOUNTER — Other Ambulatory Visit (INDEPENDENT_AMBULATORY_CARE_PROVIDER_SITE_OTHER): Payer: Managed Care, Other (non HMO) | Admitting: *Deleted

## 2015-06-23 ENCOUNTER — Encounter: Payer: Self-pay | Admitting: Internal Medicine

## 2015-06-23 VITALS — BP 104/62 | HR 93 | Temp 98.1°F | Resp 12 | Wt 165.4 lb

## 2015-06-23 DIAGNOSIS — E119 Type 2 diabetes mellitus without complications: Secondary | ICD-10-CM

## 2015-06-23 LAB — POCT GLYCOSYLATED HEMOGLOBIN (HGB A1C): Hemoglobin A1C: 6.1

## 2015-06-23 NOTE — Progress Notes (Signed)
Patient ID: Elizabeth Ware, female   DOB: [birth year], 55 y.o.   MRN: [id number]  HPI: MARIAGUADALUPE FIALKOWSKI is a 55 y.o.-year-old female, returning for f/u for DM2, prev GDM, dx in 05/2014, non-insulin-dependent, uncontrolled, without complications. Last visit 4 mo ago.  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   Her ear hurts after dental cleaning.  Last hemoglobin A1c was: Lab Results  Component Value Date   HGBA1C 6.3 02/18/2015   HGBA1C 8.3* 09/05/2014   HGBA1C 11.8* 06/19/2014   Pt is on a regimen of: - Glyxambi 25-5 moved in am - Glipizide XL 5 mg in am, before b'fast. Tried Metformin before >> nausea.  Pt checks her sugars 0-1x a day and they are: - am: 140-160 (previously 190-220 before starting med) >> 140-150 >> 104-118 >> 120s - 2h after b'fast: n/c  - before lunch: n/c - 2h after lunch: n/c >> 172-200 >> 160 >> n/c - before dinner: n/c - 2h after dinner: n/c - bedtime: n/c - nighttime: n/c No lows. Lowest sugar was 140 >> 100 >> 118; + hypoglycemia awareness - ? level Highest sugar was 220 >> 200 >> 160 >> 170 (after Christmas meal).  Glucometer: One Touch Verio  Pt's meals are: - Breakfast: 2 Eggos + fruit - Lunch: pita tuna; [employer]; yoghurt - Dinner: meat + veggies + spaghetti - Snacks: dill pickles, nuts, chocolate, potato chips, grapes Stopped sweet tea, when dx'ed. She saw nutrition since last visit >> it helped.  Used to exercise 2-4 x a week >> now on hold.  - no CKD, last BUN/creatinine:  Lab Results  Component Value Date   BUN 13 01/06/2015   CREATININE 0.93 01/06/2015  On Micardis. - last set of lipids: Lab Results  Component Value Date   CHOL 229* 09/05/2014   HDL 42.90 09/05/2014   LDLCALC 156* 09/05/2014   TRIG 151.0* 09/05/2014   CHOLHDL 5 09/05/2014   - last eye exam was in 08/05/2014 (Dr. Nile Riggs). No DR.  - no numbness and tingling in her feet.  ROS: Constitutional: no weight loss, no fatigue, no subjective  hyperthermia/hypothermia Eyes: no blurry vision, no xerophthalmia ENT: no sore throat, no nodules palpated in throat, no dysphagia/odynophagia, no hoarseness Cardiovascular: no CP/SOB/palpitations/leg swelling Respiratory: no cough/SOB Gastrointestinal: no N/V/D/C/heartburn Musculoskeletal: no muscle/joint aches Skin: no rashes Neurological: no tremors/numbness/tingling/dizziness  I reviewed pt's medications, allergies, PMH, social hx, family hx, and changes were documented in the history of present illness. Otherwise, unchanged from my initial visit note.  Past Medical History  Diagnosis Date  . Diabetes mellitus without complication (HCC)   . Hypertension   . Diverticulitis    Past Surgical History  Procedure Laterality Date  . Colon surgery     History   Social History  . Marital Status: Married    Spouse Name: N/A  . Number of Children: 1   Occupational History  .    Social History Main Topics  . Smoking status: Never Smoker   . Smokeless tobacco: Not on file  . Alcohol Use: No  . Drug Use: No   Current Outpatient Prescriptions on File Prior to Visit  Medication Sig Dispense Refill  . doxycycline (VIBRA-TABS) 100 MG tablet Take 1 tablet (100 mg total) by mouth 2 (two) times daily. 20 tablet 0  . Empagliflozin-Linagliptin (GLYXAMBI) 25-5 MG TABS Take 1 tablet by mouth every morning. 90 tablet 3  . glipiZIDE (GLUCOTROL XL) 5 MG 24 hr tablet  Take 1 tablet (5 mg total) by mouth daily with breakfast. 90 tablet 0  . glucose blood (ONETOUCH VERIO) test strip Use as instructed 50 each 11  . glucose blood (ONETOUCH VERIO) test strip Use as instructed 50 each 11  . hydrochlorothiazide (HYDRODIURIL) 25 MG tablet Take 0.5 tablets (12.5 mg total) by mouth daily. (Patient taking differently: Take 25 mg by mouth daily. ) 90 tablet 5  . Mometasone Furo-Formoterol Fum (DULERA) 200-5 MCG/ACT AERO Inhale 1 puff into the lungs 2 (two) times daily. 1 Inhaler 5  . ONETOUCH DELICA LANCETS  FINE MISC 1 Device by Does not apply route daily as needed. 50 each 3  . telmisartan (MICARDIS) 80 MG tablet Take 1 tablet (80 mg total) by mouth daily. (Patient taking differently: Take 40 mg by mouth daily. ) 30 tablet 11  . telmisartan (MICARDIS) 80 MG tablet Take 1 tablet (80 mg total) by mouth daily. 90 tablet 1  . TRANXENE-T 7.5 MG tablet Take 1 tablet (7.5 mg total) by mouth 2 (two) times daily as needed for anxiety or sleep. 60 tablet 1  . loratadine (CLARITIN) 10 MG tablet Take 1 tablet (10 mg total) by mouth daily. (Patient not taking: Reported on 06/23/2015) 100 tablet 3   No current facility-administered medications on file prior to visit.   Allergies  Allergen Reactions  . Cetirizine Hcl     REACTION: fatigue  . Metformin And Related Nausea And Vomiting  . Penicillins Nausea And Vomiting  . Valsartan     REACTION: rash   Family History  Problem Relation Age of Onset  . Diabetes Mother   . Stroke Mother   Thyroid ds. And HTN in Mother.  PE: BP 104/62 mmHg  Pulse 93  Temp(Src) 98.1 F (36.7 C) (Oral)  Resp 12  Wt 165 lb 6.4 oz (75.025 kg)  SpO2 97% Body mass index is 28.38 kg/(m^2).  Wt Readings from Last 3 Encounters:  06/23/15 165 lb 6.4 oz (75.025 kg)  02/18/15 170 lb (77.111 kg)  01/06/15 166 lb (75.297 kg)   Constitutional: overweight, in NAD Eyes: PERRLA, EOMI, no exophthalmos ENT: moist mucous membranes, no thyromegaly, no cervical lymphadenopathy; R ear clear Cardiovascular: Tachycardia, regular rhythm, No MRG Respiratory: CTA B Gastrointestinal: abdomen soft, NT, ND, BS+ Musculoskeletal: no deformities, strength intact in all 4 Skin: moist, warm, no rashes Neurological: no tremor with outstretched hands, DTR normal in all 4  ASSESSMENT: 1. DM2, non-insulin-dependent, uncontrolled, without complications  PLAN:  1. Patient with a recent dx of diabetes, on oral antidiabetic regimen with a DPP4 inhibitor + a SGLT2 R blocker + Glipizide XL 5 mg  dailyThis appears to be working well >> sugars are at goal but she only checks in am >> advised to start rotating check times. - I suggested to:  Patient Instructions  Please continue Glyxambi 25-5 mg daily before breakfast. Continue Glipizide XL 5 mg in am, before b'fast  Please return in 3 months with your sugar log.   - continue checking sugars at different times of the day - check 2 times a day, rotating checks - advised for yearly eye exams >> she is UTD - given flu shot at last visit - check HbA1c >>  - Return to clinic in 3 mo with sugar log

## 2015-06-23 NOTE — Patient Instructions (Signed)
Please continue Glyxambi 25-5 mg daily before breakfast. Continue Glipizide XL 5 mg in am, before b'fast.

## 2015-06-26 ENCOUNTER — Other Ambulatory Visit: Payer: Self-pay | Admitting: Internal Medicine

## 2015-07-15 ENCOUNTER — Other Ambulatory Visit: Payer: Self-pay | Admitting: Obstetrics and Gynecology

## 2015-07-16 ENCOUNTER — Other Ambulatory Visit: Payer: Self-pay | Admitting: Internal Medicine

## 2015-07-16 LAB — CYTOLOGY - PAP

## 2015-07-23 ENCOUNTER — Telehealth: Payer: Self-pay | Admitting: Internal Medicine

## 2015-07-23 MED ORDER — EMPAGLIFLOZIN-LINAGLIPTIN 25-5 MG PO TABS: 1.0000 | ORAL_TABLET | Freq: Every morning | ORAL | Status: DC

## 2015-07-23 NOTE — Telephone Encounter (Signed)
Pt states [facility] on  [address] Has been requesting refill for Empagliflozin-Linagliptin (GLYXAMBI) 25-5 MG TABS [[id number]]  For several days now. She is needing this medication. I did get her scheduled for a physical end of March

## 2015-07-23 NOTE — Telephone Encounter (Signed)
Sent refill to [facility] 

## 2015-07-25 NOTE — Telephone Encounter (Signed)
Pharmacy faxed two different times regarding insurance needs PA Can you please send pa to insurance

## 2015-07-28 NOTE — Telephone Encounter (Signed)
Cover my meds PA started.   KEY: [id number]

## 2015-07-30 ENCOUNTER — Telehealth: Payer: Self-pay

## 2015-07-30 NOTE — Telephone Encounter (Signed)
PA came back approved.   Pt informed of same.  Pharmacy contacted and informed of same. Pharm stated that rx went through and pt has a $0 copay.

## 2015-07-30 NOTE — Telephone Encounter (Signed)
Submitted clinical data to plan for determination of coverage 

## 2015-07-30 NOTE — Telephone Encounter (Signed)
Error

## 2015-07-30 NOTE — Telephone Encounter (Signed)
Spoke to pt and she gave the correct rx coverage benefit information.   BIN: [id number] PCN: [id number] GRP: [id number]  KEYCatha Brow: [id number]

## 2015-07-30 NOTE — Telephone Encounter (Signed)
Pt left message on triage vm: RE: note below.   LVM for pt to call back as soon as possible.

## 2015-07-30 NOTE — Telephone Encounter (Signed)
PA came back that pt does not have rx benefits with [employer].   Spoke to pt and she is going to call me back and give me the rx benefit information that I need to do the PA.

## 2015-08-22 ENCOUNTER — Encounter: Payer: Managed Care, Other (non HMO) | Admitting: Internal Medicine

## 2015-09-12 ENCOUNTER — Encounter: Payer: Managed Care, Other (non HMO) | Admitting: Internal Medicine

## 2015-09-15 ENCOUNTER — Other Ambulatory Visit: Payer: Self-pay | Admitting: Internal Medicine

## 2015-09-19 ENCOUNTER — Other Ambulatory Visit (INDEPENDENT_AMBULATORY_CARE_PROVIDER_SITE_OTHER): Payer: Managed Care, Other (non HMO)

## 2015-09-19 ENCOUNTER — Ambulatory Visit (INDEPENDENT_AMBULATORY_CARE_PROVIDER_SITE_OTHER): Payer: Managed Care, Other (non HMO) | Admitting: Internal Medicine

## 2015-09-19 ENCOUNTER — Encounter: Payer: Self-pay | Admitting: Internal Medicine

## 2015-09-19 VITALS — BP 118/64 | HR 76 | Ht 64.0 in | Wt 170.0 lb

## 2015-09-19 DIAGNOSIS — Z0000 Encounter for general adult medical examination without abnormal findings: Secondary | ICD-10-CM

## 2015-09-19 LAB — CBC WITH DIFFERENTIAL/PLATELET
BASOS PCT: 0.6 % (ref 0.0–3.0)
Basophils Absolute: 0 10*3/uL (ref 0.0–0.1)
EOS PCT: 2.3 % (ref 0.0–5.0)
Eosinophils Absolute: 0.2 10*3/uL (ref 0.0–0.7)
HEMATOCRIT: 37.2 % (ref 36.0–46.0)
Hemoglobin: 12.8 g/dL (ref 12.0–15.0)
LYMPHS ABS: 2 10*3/uL (ref 0.7–4.0)
Lymphocytes Relative: 27 % (ref 12.0–46.0)
MCHC: 34.3 g/dL (ref 30.0–36.0)
MCV: 84.4 fl (ref 78.0–100.0)
MONO ABS: 0.6 10*3/uL (ref 0.1–1.0)
MONOS PCT: 8.4 % (ref 3.0–12.0)
NEUTROS ABS: 4.5 10*3/uL (ref 1.4–7.7)
Neutrophils Relative %: 61.7 % (ref 43.0–77.0)
PLATELETS: 324 10*3/uL (ref 150.0–400.0)
RBC: 4.41 Mil/uL (ref 3.87–5.11)
RDW: 12.6 % (ref 11.5–15.5)
WBC: 7.3 10*3/uL (ref 4.0–10.5)

## 2015-09-19 LAB — LIPID PANEL
CHOL/HDL RATIO: 3
Cholesterol: 178 mg/dL (ref 0–200)
HDL: 52.7 mg/dL (ref >39.00)
LDL CALC: 111 mg/dL — AB (ref 0–99)
NonHDL: 125.78
TRIGLYCERIDES: 76 mg/dL (ref 0.0–149.0)
VLDL: 15.2 mg/dL (ref 0.0–40.0)

## 2015-09-19 LAB — BASIC METABOLIC PANEL
BUN: 17 mg/dL (ref 6–23)
CHLORIDE: 103 meq/L (ref 96–112)
CO2: 30 meq/L (ref 19–32)
Calcium: 9.3 mg/dL (ref 8.4–10.5)
Creatinine, Ser: 0.74 mg/dL (ref 0.40–1.20)
GFR: 86.62 mL/min (ref >60.00)
GLUCOSE: 86 mg/dL (ref 70–99)
POTASSIUM: 3.4 meq/L — AB (ref 3.5–5.1)
SODIUM: 140 meq/L (ref 135–145)

## 2015-09-19 LAB — URINALYSIS
Bilirubin Urine: NEGATIVE
Hgb urine dipstick: NEGATIVE
Ketones, ur: NEGATIVE
Leukocytes, UA: NEGATIVE
Nitrite: NEGATIVE
PH: 5.5 (ref 5.0–8.0)
SPECIFIC GRAVITY, URINE: 1.01 (ref 1.000–1.030)
TOTAL PROTEIN, URINE-UPE24: NEGATIVE
Urobilinogen, UA: 0.2 (ref 0.0–1.0)

## 2015-09-19 LAB — TSH: TSH: 1.42 u[IU]/mL (ref 0.35–4.50)

## 2015-09-19 LAB — HEPATIC FUNCTION PANEL
ALT: 23 U/L (ref 0–35)
AST: 20 U/L (ref 0–37)
Albumin: 4.5 g/dL (ref 3.5–5.2)
Alkaline Phosphatase: 45 U/L (ref 39–117)
BILIRUBIN DIRECT: 0.1 mg/dL (ref 0.0–0.3)
BILIRUBIN TOTAL: 0.5 mg/dL (ref 0.2–1.2)
Total Protein: 7.2 g/dL (ref 6.0–8.3)

## 2015-09-19 LAB — HEMOGLOBIN A1C: Hgb A1c MFr Bld: 6.2 % (ref 4.6–6.5)

## 2015-09-19 MED ORDER — TRANXENE-T 7.5 MG PO TABS: 7.5000 mg | ORAL_TABLET | Freq: Two times a day (BID) | ORAL | Status: DC | PRN

## 2015-09-19 MED ORDER — TELMISARTAN 80 MG PO TABS: 80.0000 mg | ORAL_TABLET | Freq: Every day | ORAL | Status: DC

## 2015-09-19 MED ORDER — HYDROCHLOROTHIAZIDE 25 MG PO TABS: 25.0000 mg | ORAL_TABLET | Freq: Every day | ORAL | Status: DC

## 2015-09-19 NOTE — Progress Notes (Signed)
Subjective:  Patient ID: Rowland LatheLisa T Meth, female    DOB: 21-Jun-1960  Age: 55 y.o. MRN: [id number]  CC: Annual Exam   HPI Rowland LatheLisa T Stehr presents for a well exam. Husband just had a bypass  Outpatient Prescriptions Prior to Visit  Medication Sig Dispense Refill  . doxycycline (VIBRA-TABS) 100 MG tablet Take 1 tablet (100 mg total) by mouth 2 (two) times daily. 20 tablet 0  . Empagliflozin-Linagliptin (GLYXAMBI) 25-5 MG TABS Take 1 tablet by mouth every morning. 90 tablet 3  . glipiZIDE (GLUCOTROL XL) 5 MG 24 hr tablet TAKE 1 TABLET BY MOUTH DAILY WITH BREAKFAST. 90 tablet 0  . glucose blood (ONETOUCH VERIO) test strip Use as instructed 50 each 11  . glucose blood (ONETOUCH VERIO) test strip Use as instructed 50 each 11  . hydrochlorothiazide (HYDRODIURIL) 25 MG tablet Take 0.5 tablets (12.5 mg total) by mouth daily. (Patient taking differently: Take 25 mg by mouth daily. ) 90 tablet 5  . Mometasone Furo-Formoterol Fum (DULERA) 200-5 MCG/ACT AERO Inhale 1 puff into the lungs 2 (two) times daily. 1 Inhaler 5  . ONETOUCH DELICA LANCETS FINE MISC 1 Device by Does not apply route daily as needed. 50 each 3  . telmisartan (MICARDIS) 80 MG tablet TAKE 1 TABLET (80 MG TOTAL) BY MOUTH DAILY. 90 tablet 0  . TRANXENE-T 7.5 MG tablet Take 1 tablet (7.5 mg total) by mouth 2 (two) times daily as needed for anxiety or sleep. 60 tablet 1  . hydrochlorothiazide (HYDRODIURIL) 25 MG tablet TAKE 1 TABLET BY MOUTH EVERY DAY 90 tablet 0  . telmisartan (MICARDIS) 80 MG tablet Take 1 tablet (80 mg total) by mouth daily. (Patient taking differently: Take 40 mg by mouth daily. ) 30 tablet 11  . loratadine (CLARITIN) 10 MG tablet Take 1 tablet (10 mg total) by mouth daily. (Patient not taking: Reported on 06/23/2015) 100 tablet 3   No facility-administered medications prior to visit.    ROS Review of Systems  Constitutional: Negative for chills, activity change, appetite change, fatigue and unexpected weight change.   HENT: Negative for congestion, mouth sores and sinus pressure.   Eyes: Negative for visual disturbance.  Respiratory: Negative for cough and chest tightness.   Gastrointestinal: Negative for nausea and abdominal pain.  Genitourinary: Negative for frequency, difficulty urinating and vaginal pain.  Musculoskeletal: Negative for back pain and gait problem.  Skin: Negative for pallor and rash.  Neurological: Negative for dizziness, tremors, weakness, numbness and headaches.  Psychiatric/Behavioral: Negative for suicidal ideas, confusion and sleep disturbance.    Objective:  BP 118/64 mmHg  Pulse 76  Ht 5\' 4"  (1.626 m)  Wt 170 lb (77.111 kg)  BMI 29.17 kg/m2  SpO2 96%  LMP  (Approximate)  BP Readings from Last 3 Encounters:  09/19/15 118/64  06/23/15 104/62  02/18/15 104/68    Wt Readings from Last 3 Encounters:  09/19/15 170 lb (77.111 kg)  06/23/15 165 lb 6.4 oz (75.025 kg)  02/18/15 170 lb (77.111 kg)    Physical Exam  Constitutional: She appears well-developed. No distress.  HENT:  Head: Normocephalic.  Right Ear: External ear normal.  Left Ear: External ear normal.  Nose: Nose normal.  Mouth/Throat: Oropharynx is clear and moist.  Eyes: Conjunctivae are normal. Pupils are equal, round, and reactive to light. Right eye exhibits no discharge. Left eye exhibits no discharge.  Neck: Normal range of motion. Neck supple. No JVD present. No tracheal deviation present. No thyromegaly present.  Cardiovascular: Normal  rate, regular rhythm and normal heart sounds.   Pulmonary/Chest: No stridor. No respiratory distress. She has no wheezes.  Abdominal: Soft. Bowel sounds are normal. She exhibits no distension and no mass. There is no tenderness. There is no rebound and no guarding.  Musculoskeletal: She exhibits no edema or tenderness.  Lymphadenopathy:    She has no cervical adenopathy.  Neurological: She displays normal reflexes. No cranial nerve deficit. She exhibits normal  muscle tone. Coordination normal.  Skin: No rash noted. No erythema.  Psychiatric: She has a normal mood and affect. Her behavior is normal. Judgment and thought content normal.    Lab Results  Component Value Date   WBC 9.9 01/06/2015   HGB 12.8 01/06/2015   HCT 38.6 01/06/2015   PLT 314 01/06/2015   GLUCOSE 117* 01/06/2015   CHOL 229* 09/05/2014   TRIG 151.0* 09/05/2014   HDL 42.90 09/05/2014   LDLCALC 156* 09/05/2014   ALT 40* 06/19/2014   AST 29 06/19/2014   NA 140 01/06/2015   K 3.3* 01/06/2015   CL 103 01/06/2015   CREATININE 0.93 01/06/2015   BUN 13 01/06/2015   CO2 25 01/06/2015   TSH 1.97 06/19/2014   INR 0.9 04/11/2007   HGBA1C 6.1 06/23/2015    US Breast Ltd Uni Right Inc Axilla  03/03/2015  CLINICAL DATA:  Follow-up of probably benign right breast 9 o'clock cluster of cysts. EXAM: ULTRASOUND OF THE RIGHT BREAST COMPARISON:  Previous exam(s). FINDINGS: On physical exam, no suspicious masses are found in the right 9 o'clock breast. Targeted ultrasound is performed, showing right breast 9 o'clock 6 cm from the nipple hypoechoic circumscribed horizontally oriented septated nodule which measures 0.6 x 0.4 x 0.4 cm, and has sonographic appearance suggestive of cluster of cysts/micro cysts. No suspicious masses are seen. IMPRESSION: Persistent probably benign right breast 9 o'clock nodule, with the appearance of a cluster of cysts. RECOMMENDATION: Diagnostic mammogram and possibly ultrasound of the right breast in 6 months. (Code:FI-R-20M) I have discussed the findings and recommendations with the patient. Results were also provided in writing at the conclusion of the visit. If applicable, a reminder letter will be sent to the patient regarding the next appointment. BI-RADS CATEGORY  3: Probably benign finding(s) - short interval follow-up suggested. Electronically Signed   By: Ted Mcalpine M.D.   On: 03/03/2015 17:02    Assessment & Plan:   There are no diagnoses  linked to this encounter. I am having Ms. Heiland maintain her mometasone-formoterol, loratadine, TRANXENE-T, ONETOUCH DELICA LANCETS FINE, glucose blood, glucose blood, hydrochlorothiazide, doxycycline, glipiZIDE, Empagliflozin-Linagliptin, and telmisartan.  No orders of the defined types were placed in this encounter.     Follow-up: No Follow-up on file.  Sonda Primes, MD

## 2015-09-19 NOTE — Progress Notes (Signed)
Pre visit review using our clinic review tool, if applicable. No additional management support is needed unless otherwise documented below in the visit note. 

## 2015-09-19 NOTE — Assessment & Plan Note (Signed)
2008 Dr Ewing SchleinMagod We discussed age appropriate health related issues, including available/recomended screening tests and vaccinations. We discussed a need for adhering to healthy diet and exercise. Labs/EKG were reviewed/ordered. All questions were answered.

## 2015-09-20 LAB — HEPATITIS C ANTIBODY: HCV AB: NEGATIVE

## 2015-09-22 ENCOUNTER — Other Ambulatory Visit: Payer: Self-pay | Admitting: Obstetrics and Gynecology

## 2015-09-22 ENCOUNTER — Other Ambulatory Visit: Payer: Self-pay

## 2015-09-22 DIAGNOSIS — N6001 Solitary cyst of right breast: Secondary | ICD-10-CM

## 2015-09-22 NOTE — Telephone Encounter (Signed)
Elizabeth Ware with pharmacy - ([facility] [address] [phone]  Called to rq generic for rx Tranxene 7.5 mg  Please advise if generic is okay or if pt should be contacted to confirm no previous allergy or contraindication.

## 2015-09-22 NOTE — Telephone Encounter (Signed)
Spoke to pharmacy and pt wants to try the generic rx.   LVM for pt to call back as soon as possible.  RE: confirming generic is requested.

## 2015-09-22 NOTE — Telephone Encounter (Signed)
The pt prefers brand Thx

## 2015-09-25 ENCOUNTER — Telehealth: Payer: Self-pay | Admitting: Internal Medicine

## 2015-09-25 NOTE — Telephone Encounter (Signed)
Ok generic Thx 

## 2015-09-25 NOTE — Telephone Encounter (Signed)
Ree KidaJack informed ok to fill generic tranxene.

## 2015-09-25 NOTE — Telephone Encounter (Signed)
Calling in regards to tranxene.  Patient is requesting generic.  Needs call back with OK.

## 2015-10-01 ENCOUNTER — Ambulatory Visit
Admission: RE | Admit: 2015-10-01 | Discharge: 2015-10-01 | Disposition: A | Discharge status: Home / Self Care | Payer: Managed Care, Other (non HMO) | Source: Ambulatory Visit | Attending: Obstetrics and Gynecology | Admitting: Obstetrics and Gynecology

## 2015-10-01 DIAGNOSIS — N6001 Solitary cyst of right breast: Secondary | ICD-10-CM

## 2015-10-12 ENCOUNTER — Other Ambulatory Visit: Payer: Self-pay | Admitting: Internal Medicine

## 2015-11-26 ENCOUNTER — Other Ambulatory Visit: Payer: Self-pay | Admitting: Internal Medicine

## 2015-12-11 ENCOUNTER — Encounter: Payer: Self-pay | Admitting: Internal Medicine

## 2015-12-11 ENCOUNTER — Other Ambulatory Visit: Payer: Self-pay

## 2015-12-11 LAB — HM DIABETES EYE EXAM

## 2015-12-11 MED ORDER — GLUCOSE BLOOD VI STRP: 1.0000 | ORAL_STRIP | Freq: Two times a day (BID) | Status: DC

## 2016-01-11 ENCOUNTER — Other Ambulatory Visit: Payer: Self-pay | Admitting: Internal Medicine

## 2016-04-12 ENCOUNTER — Other Ambulatory Visit: Payer: Self-pay | Admitting: Internal Medicine

## 2016-04-20 ENCOUNTER — Telehealth: Payer: Self-pay | Admitting: Internal Medicine

## 2016-04-20 NOTE — Telephone Encounter (Signed)
Pt called in and is looking for a form that was suppose to be sent from [facility] for her sugar meds.  Can you call her when you get a chance   Best number [phone] Rx [phone]  02

## 2016-04-22 MED ORDER — EMPAGLIFLOZIN-LINAGLIPTIN 25-5 MG PO TABS: 1.0000 | ORAL_TABLET | Freq: Every morning | ORAL | 3 refills | Status: DC

## 2016-04-22 NOTE — Telephone Encounter (Signed)
I called pt- she needs something done with her Glyxambi. She does not know what is needed. She states [facility] faxed us something, but that she uses a card to receive this medication for free sometimes.   ??   I advised her I have not received any calls/faxes for her med. I [facility]. See meds. I will call pharmacy to see if anything else is needed from us.

## 2016-04-23 NOTE — Telephone Encounter (Signed)
Pt called and stated the prescription that was called in was $500 and was going to need something cheaper. Please advise thanks.

## 2016-04-26 NOTE — Telephone Encounter (Signed)
Our next option would be Insulin. Pls sch OV to discuss Thx

## 2016-04-27 NOTE — Telephone Encounter (Signed)
PA initiated and APPROVED via [facility] [phone] from 04/27/2016 - 04/27/2017. Pt advised of same

## 2016-04-27 NOTE — Telephone Encounter (Signed)
Pt informed- she states she will not take insulin. She states [facility] is requiring PA for Glyxambi.     Pt requests call back with determination/outcome.  PA # is [phone]

## 2016-05-20 ENCOUNTER — Other Ambulatory Visit: Payer: Self-pay | Admitting: Internal Medicine

## 2016-06-03 ENCOUNTER — Telehealth: Payer: Self-pay | Admitting: Internal Medicine

## 2016-06-03 NOTE — Telephone Encounter (Signed)
States mother was just diagnosed with flu A.  Mother MD advised patient to call Dr. Macario GoldsPlot to get tamaflu sent to her pharmacy.  [facility] [address]

## 2016-06-04 MED ORDER — OSELTAMIVIR PHOSPHATE 75 MG PO CAPS: 75.0000 mg | ORAL_CAPSULE | Freq: Every day | ORAL | 0 refills | Status: DC

## 2016-06-04 NOTE — Telephone Encounter (Signed)
Pt informed

## 2016-06-04 NOTE — Telephone Encounter (Signed)
OK. Thx

## 2016-07-08 ENCOUNTER — Ambulatory Visit: Payer: Managed Care, Other (non HMO) | Admitting: Internal Medicine

## 2016-08-21 ENCOUNTER — Other Ambulatory Visit: Payer: Self-pay | Admitting: Internal Medicine

## 2016-08-24 ENCOUNTER — Other Ambulatory Visit: Payer: Self-pay | Admitting: Internal Medicine

## 2016-09-03 ENCOUNTER — Ambulatory Visit: Payer: Managed Care, Other (non HMO) | Admitting: Internal Medicine

## 2016-09-06 ENCOUNTER — Encounter: Payer: Self-pay | Admitting: Podiatry

## 2016-09-06 ENCOUNTER — Ambulatory Visit (INDEPENDENT_AMBULATORY_CARE_PROVIDER_SITE_OTHER): Payer: Managed Care, Other (non HMO) | Admitting: Podiatry

## 2016-09-06 VITALS — BP 118/75 | HR 79 | Resp 16 | Ht 64.0 in | Wt 168.0 lb

## 2016-09-06 DIAGNOSIS — B351 Tinea unguium: Secondary | ICD-10-CM

## 2016-09-06 NOTE — Progress Notes (Signed)
Subjective:     Patient ID: Elizabeth Ware, female   DOB: 08-Sep-1960, 56 y.o.   MRN: [id number]  HPI patient presents with fungal infection of the left one 234 and 5 digits with mild on the right with skin changes also noted. Patient states it's been present approximately year and she does have diabetes that's under very good control   Review of Systems  All other systems reviewed and are negative.      Objective:   Physical Exam  Constitutional: She is oriented to person, place, and time.  Cardiovascular: Intact distal pulses.   Musculoskeletal: Normal range of motion.  Neurological: She is oriented to person, place, and time.  Skin: Skin is warm.  Nursing note and vitals reviewed.  neurovascular status intact muscle strength was adequate with range of motion within normal limits with patient found to have discoloration of all nailbeds on the left foot with thickness of the beds noted and irritation. 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 She wants treatment but I first 1 understand what the fungal element is an eye using sterile instrumentation took off nail thick dystrophic tissue from the left hallux second and third nails and we'll send this off for culture and pathology and after we get the results we'll review what treatments would be of an affix for her including oral laser and topical

## 2016-09-06 NOTE — Progress Notes (Signed)
   Subjective:    Patient ID: Elizabeth Ware, female    DOB: [birth year], 56 y.o.   MRN: [id number]  HPI Chief Complaint  Patient presents with  . Nail Problem    Left foot; nail discoloration & thickened nails; pt stated, "Wants to have all nails checked for nail fungus"; x6 months  . Callouses    Bilateral; Plantar forefoot & great toes-medial sides; pt Diabetic Type 2; Sugar=did not check today; A1C=6.2      Review of Systems  All other systems reviewed and are negative.      Objective:   Physical Exam        Assessment & Plan:

## 2016-09-20 ENCOUNTER — Ambulatory Visit (INDEPENDENT_AMBULATORY_CARE_PROVIDER_SITE_OTHER): Payer: Managed Care, Other (non HMO) | Admitting: Internal Medicine

## 2016-09-20 ENCOUNTER — Encounter: Payer: Self-pay | Admitting: Internal Medicine

## 2016-09-20 VITALS — BP 110/72 | HR 86 | Temp 97.5°F | Ht 64.0 in | Wt 177.0 lb

## 2016-09-20 DIAGNOSIS — E119 Type 2 diabetes mellitus without complications: Secondary | ICD-10-CM | POA: Diagnosis not present

## 2016-09-20 DIAGNOSIS — I10 Essential (primary) hypertension: Secondary | ICD-10-CM

## 2016-09-20 DIAGNOSIS — Z0000 Encounter for general adult medical examination without abnormal findings: Secondary | ICD-10-CM

## 2016-09-20 DIAGNOSIS — Z23 Encounter for immunization: Secondary | ICD-10-CM

## 2016-09-20 MED ORDER — TRANXENE-T 7.5 MG PO TABS: 7.5000 mg | ORAL_TABLET | Freq: Two times a day (BID) | ORAL | 3 refills | Status: DC | PRN

## 2016-09-20 NOTE — Assessment & Plan Note (Signed)
HCTZ, Micardis

## 2016-09-20 NOTE — Progress Notes (Signed)
Subjective:  Patient ID: Elizabeth Ware, female    DOB: 09/17/1960  Age: 56 y.o. MRN: [id number]  CC: No chief complaint on file.   HPI Elizabeth Ware presents for a well exam F/u DM Mom w/dementia living with Misty Stanley  Outpatient Medications Prior to Visit  Medication Sig Dispense Refill  . Empagliflozin-Linagliptin (GLYXAMBI) 25-5 MG TABS Take 1 tablet by mouth every morning. 90 tablet 3  . glipiZIDE (GLUCOTROL XL) 5 MG 24 hr tablet TAKE 1 TABLET BY MOUTH DAILY WITH BREAKFAST. 30 tablet 0  . glucose blood (ONETOUCH VERIO) test strip 1 each by Other route 2 (two) times daily. 180 each 1  . hydrochlorothiazide (HYDRODIURIL) 25 MG tablet Take 1 tablet (25 mg total) by mouth daily. 90 tablet 3  . ONETOUCH DELICA LANCETS FINE MISC 1 Device by Does not apply route daily as needed. 50 each 3  . telmisartan (MICARDIS) 80 MG tablet Take 1 tablet (80 mg total) by mouth daily. (Patient taking differently: Take 80 mg by mouth daily. Pt takes 1/2 tablet to equal 40 mg) 90 tablet 3  . TRANXENE-T 7.5 MG tablet Take 1 tablet (7.5 mg total) by mouth 2 (two) times daily as needed for anxiety or sleep. 60 tablet 3  . loratadine (CLARITIN) 10 MG tablet Take 1 tablet (10 mg total) by mouth daily. (Patient not taking: Reported on 06/23/2015) 100 tablet 3  . Mometasone Furo-Formoterol Fum (DULERA) 200-5 MCG/ACT AERO Inhale 1 puff into the lungs 2 (two) times daily. 1 Inhaler 5   No facility-administered medications prior to visit.     ROS Review of Systems  Constitutional: Negative for activity change, appetite change, chills, fatigue and unexpected weight change.  HENT: Negative for congestion, mouth sores and sinus pressure.   Eyes: Negative for visual disturbance.  Respiratory: Negative for cough and chest tightness.   Gastrointestinal: Negative for abdominal pain and nausea.  Genitourinary: Negative for difficulty urinating, frequency and vaginal pain.  Musculoskeletal: Negative for back pain and gait  problem.  Skin: Negative for pallor and rash.  Neurological: Negative for dizziness, tremors, weakness, numbness and headaches.  Psychiatric/Behavioral: Negative for confusion, sleep disturbance and suicidal ideas. The patient is not nervous/anxious.     Objective:  BP 110/72 (BP Location: Left Arm, Patient Position: Sitting, Cuff Size: Normal)   Pulse 86   Temp 97.5 F (36.4 C) (Oral)   Ht  (1.626 m)   Wt 177 lb 0.6 oz (80.3 kg)   SpO2 99%   BMI 30.39 kg/m   BP Readings from Last 3 Encounters:  09/20/16 110/72  09/06/16 118/75  09/19/15 118/64    Wt Readings from Last 3 Encounters:  09/20/16 177 lb 0.6 oz (80.3 kg)  09/06/16 168 lb (76.2 kg)  09/19/15 170 lb (77.1 kg)    Physical Exam  Constitutional: She appears well-developed. No distress.  HENT:  Head: Normocephalic.  Right Ear: External ear normal.  Left Ear: External ear normal.  Nose: Nose normal.  Mouth/Throat: Oropharynx is clear and moist.  Eyes: Conjunctivae are normal. Pupils are equal, round, and reactive to light. Right eye exhibits no discharge. Left eye exhibits no discharge.  Neck: Normal range of motion. Neck supple. No JVD present. No tracheal deviation present. No thyromegaly present.  Cardiovascular: Normal rate, regular rhythm and normal heart sounds.   Pulmonary/Chest: No stridor. No respiratory distress. She has no wheezes.  Abdominal: Soft. Bowel sounds are normal. She exhibits no distension and no mass. There is no  tenderness. There is no rebound and no guarding.  Musculoskeletal: She exhibits no edema or tenderness.  Lymphadenopathy:    She has no cervical adenopathy.  Neurological: She displays normal reflexes. No cranial nerve deficit. She exhibits normal muscle tone. Coordination normal.  Skin: No rash noted. No erythema.  Psychiatric: She has a normal mood and affect. Her behavior is normal. Judgment and thought content normal.    Lab Results  Component Value Date   WBC 7.3  09/19/2015   HGB 12.8 09/19/2015   HCT 37.2 09/19/2015   PLT 324.0 09/19/2015   GLUCOSE 86 09/19/2015   CHOL 178 09/19/2015   TRIG 76.0 09/19/2015   HDL 52.70 09/19/2015   LDLCALC 111 (H) 09/19/2015   ALT 23 09/19/2015   AST 20 09/19/2015   NA 140 09/19/2015   K 3.4 (L) 09/19/2015   CL 103 09/19/2015   CREATININE 0.74 09/19/2015   BUN 17 09/19/2015   CO2 30 09/19/2015   TSH 1.42 09/19/2015   INR 0.9 04/11/2007   HGBA1C 6.2 09/19/2015    Mm Diag Breast Tomo Bilateral  Result Date: 10/01/2015 CLINICAL DATA:  Short-term follow-up for probably benign finding in the right breast. EXAM: 2D DIGITAL DIAGNOSTIC BILATERAL MAMMOGRAM WITH CAD AND ADJUNCT TOMO COMPARISON:  Previous exams including diagnostic mammogram and ultrasound dated 07/01/2014. ACR Breast Density Category b: There are scattered areas of fibroglandular density. FINDINGS: Bilateral 2D CC and MLO projections were obtained today, with additional 3D tomosynthesis. The previously identified mass within the outer right breast appears slightly smaller when compared to the mammograms of 06/12/2014 and 07/01/2014. In retrospect, the finding is stable or slightly smaller compared to the earlier mammogram from 2014 indicating benignity. There are no new dominant masses, suspicious calcifications or secondary signs of malignancy within either breast. Mammographic images were processed with CAD. IMPRESSION: Benign mass within the outer right breast, compatible with the appearance of a benign cluster of cysts on earlier ultrasound, stable or slightly smaller today when compared to earlier mammograms. Patient may return to routine annual bilateral screening mammogram schedule. RECOMMENDATION: Screening mammogram in one year.(Code:SM-B-01Y) I have discussed the findings and recommendations with the patient. Results were also provided in writing at the conclusion of the visit. If applicable, a reminder letter will be sent to the patient regarding the  next appointment. BI-RADS CATEGORY  2: Benign. Electronically Signed   By: Bary Richard M.D.   On: 10/01/2015 16:20    Assessment & Plan:   There are no diagnoses linked to this encounter. I have discontinued Ms. Oettinger's mometasone-formoterol. I am also having her maintain her loratadine, ONETOUCH DELICA LANCETS FINE, hydrochlorothiazide, TRANXENE-T, telmisartan, glucose blood, glipiZIDE, Empagliflozin-Linagliptin, and fluticasone.  Meds ordered this encounter  Medications  . fluticasone (FLONASE) 50 MCG/ACT nasal spray    Sig: Place into both nostrils daily.     Follow-up: No Follow-up on file.  Sonda Primes, MD

## 2016-09-20 NOTE — Progress Notes (Signed)
Pre visit review using our clinic review tool, if applicable. No additional management support is needed unless otherwise documented below in the visit note. 

## 2016-09-20 NOTE — Patient Instructions (Signed)
Shingrix vaccine

## 2016-09-20 NOTE — Assessment & Plan Note (Signed)
We discussed age appropriate health related issues, including available/recomended screening tests and vaccinations. We discussed a need for adhering to healthy diet and exercise. Labs ordered. All questions were answered. Colon due 2018 Shingrix vaccine suggested

## 2016-09-20 NOTE — Assessment & Plan Note (Signed)
On Glyxambi, Glipizide Labs

## 2016-10-01 ENCOUNTER — Ambulatory Visit (INDEPENDENT_AMBULATORY_CARE_PROVIDER_SITE_OTHER): Payer: Managed Care, Other (non HMO) | Admitting: Internal Medicine

## 2016-10-01 ENCOUNTER — Encounter: Payer: Self-pay | Admitting: Internal Medicine

## 2016-10-01 VITALS — BP 112/72 | HR 94 | Ht 64.0 in | Wt 175.0 lb

## 2016-10-01 DIAGNOSIS — E119 Type 2 diabetes mellitus without complications: Secondary | ICD-10-CM

## 2016-10-01 LAB — POCT GLYCOSYLATED HEMOGLOBIN (HGB A1C): Hemoglobin A1C: 6.2

## 2016-10-01 MED ORDER — EMPAGLIFLOZIN-LINAGLIPTIN 25-5 MG PO TABS: 1.0000 | ORAL_TABLET | Freq: Every morning | ORAL | 3 refills | Status: DC

## 2016-10-01 MED ORDER — HYDROCHLOROTHIAZIDE 25 MG PO TABS: 25.0000 mg | ORAL_TABLET | Freq: Every day | ORAL | 1 refills | Status: DC

## 2016-10-01 MED ORDER — GLIPIZIDE ER 5 MG PO TB24: 5.0000 mg | ORAL_TABLET | Freq: Every day | ORAL | 3 refills | Status: DC

## 2016-10-01 NOTE — Patient Instructions (Addendum)
Please continue: - Glyxambi 25-5 mg daily before b'ast. - Glipizide XL 5 mg in am, before b'fast  Please return in 6 months with your sugar log.  

## 2016-10-01 NOTE — Addendum Note (Signed)
Addended by: Darene LamerHOMPSON, Ralphine Hinks T on: 10/01/2016 03:11 PM   Modules accepted: Orders

## 2016-10-01 NOTE — Progress Notes (Signed)
Patient ID: Rowland LatheLisa T Tuma, female   DOB: [birth year], 56 y.o.   MRN: [id number]  HPI: Rowland LatheLisa T Camilo is a 56 y.o.-year-old female, returning for f/u for DM2, prev GDM, dx in 05/2014, non-insulin-dependent, uncontrolled, without complications. Last visit 1 year and 2 mo ago.  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  Last hemoglobin A1c was: Lab Results  Component Value Date   HGBA1C 6.2 09/19/2015   HGBA1C 6.1 06/23/2015   HGBA1C 6.3 02/18/2015   Pt is on a regimen of: - Glyxambi 25-5 moved in am - Glipizide XL 5 mg in am, before b'fast. Tried Metformin before >> nausea.  Pt checks her sugars seldom: - am:  140-150 >> 104-118 >> 120s >> 101-120 - 2h after b'fast: n/c  - before lunch: n/c - 2h after lunch: n/c >> 172-200 >> 160 >> n/c - before dinner: n/c - 2h after dinner: n/c - bedtime: n/c - nighttime: n/c No lows. Lowest sugar was 140 >> 100 >> 118 >> 101; + hypoglycemia awareness - ? level Highest sugar was 220 >> 200 >> 160 >> 170 (after Christmas meal) >> 120.  Glucometer: One Touch Verio  Pt's meals are: - Breakfast: 2 Eggos + fruit - Lunch: pita tuna; [employer]; yoghurt - Dinner: meat + veggies + spaghetti - Snacks: dill pickles, nuts, chocolate, potato chips, grapes She saw nutrition in te past >> it helped.  She is going to the gym 1-5x a week (body pump, zumba) and also walking her dog.   - No CKD, last BUN/creatinine:  Lab Results  Component Value Date   BUN 17 09/19/2015   CREATININE 0.74 09/19/2015  On Micardis. - last set of lipids: Lab Results  Component Value Date   CHOL 178 09/19/2015   HDL 52.70 09/19/2015   LDLCALC 111 (H) 09/19/2015   TRIG 76.0 09/19/2015   CHOLHDL 3 09/19/2015   - last eye exam was in Spring 2017(Dr. Burns). No DR reportedly.  - no numbness and tingling in her feet.  ROS: Constitutional: + weight gain/no weight loss, no fatigue, no subjective hyperthermia, no subjective hypothermia Eyes: no  blurry vision, no xerophthalmia ENT: no sore throat, no nodules palpated in throat, no dysphagia, no odynophagia, no hoarseness Cardiovascular: no CP/no SOB/no palpitations/no leg swelling Respiratory: no cough/no SOB/no wheezing Gastrointestinal: no N/no V/no D/no C/no acid reflux Musculoskeletal: no muscle aches/no joint aches Skin: no rashes, no hair loss Neurological: no tremors/no numbness/no tingling/no dizziness  I reviewed pt's medications, allergies, PMH, social hx, family hx, and changes were documented in the history of present illness. Otherwise, unchanged from my initial visit note.  Past Medical History:  Diagnosis Date  . Diabetes mellitus without complication (HCC)   . Diverticulitis   . Hypertension    Past Surgical History:  Procedure Laterality Date  . COLON SURGERY     History   Social History  . Marital Status: Married    Spouse Name: N/A  . Number of Children: 1   Occupational History  .    Social History Main Topics  . Smoking status: Never Smoker   . Smokeless tobacco: Not on file  . Alcohol Use: No  . Drug Use: No   Current Outpatient Prescriptions on File Prior to Visit  Medication Sig Dispense Refill  . Empagliflozin-Linagliptin (GLYXAMBI) 25-5 MG TABS Take 1 tablet by mouth every morning. 90 tablet 3  . fluticasone (FLONASE) 50 MCG/ACT nasal spray Place into both nostrils  daily.    . glipiZIDE (GLUCOTROL XL) 5 MG 24 hr tablet TAKE 1 TABLET BY MOUTH DAILY WITH BREAKFAST. 30 tablet 0  . glucose blood (ONETOUCH VERIO) test strip 1 each by Other route 2 (two) times daily. 180 each 1  . hydrochlorothiazide (HYDRODIURIL) 25 MG tablet Take 1 tablet (25 mg total) by mouth daily. 90 tablet 3  . ONETOUCH DELICA LANCETS FINE MISC 1 Device by Does not apply route daily as needed. 50 each 3  . telmisartan (MICARDIS) 80 MG tablet Take 1 tablet (80 mg total) by mouth daily. (Patient taking differently: Take 80 mg by mouth daily. Pt takes 1/2 tablet to equal  40 mg) 90 tablet 3  . TRANXENE-T 7.5 MG tablet Take 1 tablet (7.5 mg total) by mouth 2 (two) times daily as needed for anxiety or sleep. 60 tablet 3  . loratadine (CLARITIN) 10 MG tablet Take 1 tablet (10 mg total) by mouth daily. (Patient not taking: Reported on 06/23/2015) 100 tablet 3   No current facility-administered medications on file prior to visit.    Allergies  Allergen Reactions  . Cetirizine Hcl     REACTION: fatigue  . Metformin And Related Nausea And Vomiting  . Penicillins Nausea And Vomiting  . Valsartan     REACTION: rash   Family History  Problem Relation Age of Onset  . Diabetes Mother   . Stroke Mother   . Cancer Father        colon or liver  Thyroid ds. And HTN in Mother.  PE: BP 112/72 (BP Location: Left Arm, Patient Position: Sitting)   Pulse 94   Ht 5\' 4"  (1.626 m)   Wt 175 lb (79.4 kg)   SpO2 96%   BMI 30.04 kg/m  Body mass index is 30.04 kg/m.  Wt Readings from Last 3 Encounters:  10/01/16 175 lb (79.4 kg)  09/20/16 177 lb 0.6 oz (80.3 kg)  09/06/16 168 lb (76.2 kg)   Constitutional: overweight, in NAD Eyes: PERRLA, EOMI, no exophthalmos ENT: moist mucous membranes, no thyromegaly, no cervical lymphadenopathy Cardiovascular: RRR, No MRG Respiratory: CTA B Gastrointestinal: abdomen soft, NT, ND, BS+ Musculoskeletal: no deformities, strength intact in all 4 Skin: moist, warm, no rashes Neurological: no tremor with outstretched hands, DTR normal in all 4  ASSESSMENT: 1. DM2, non-insulin-dependent, uncontrolled, without long term complications  PLAN:  1. Patient with h/o controlled diabetes, returning after a long absence. She continues on oral antidiabetic regimen with a DPP4 inhibitor + a SGLT2 R blocker + Glipizide XL 5 mg daily. She is seldom checking sugars >> usually at goal.  - I again advised her to start checking sugars consistently, once every day or every other day, rotating check times - no SEs from the meds - will continue  current regimen - I suggested to:  Patient Instructions  Please continue: - Glyxambi 25-5 mg daily before b'ast. - Glipizide XL 5 mg in am, before b'fast  Please return in 6 months with your sugar log.   - today, HbA1c is 6.2%  - advised for yearly eye exams >> she is UTD, but needs one - Return to clinic in 6 mo with sugar log    Carlus Pavlov, MD PhD [facility]ocr[facility]

## 2016-10-06 ENCOUNTER — Other Ambulatory Visit: Payer: Self-pay | Admitting: Internal Medicine

## 2016-10-11 ENCOUNTER — Ambulatory Visit (INDEPENDENT_AMBULATORY_CARE_PROVIDER_SITE_OTHER): Payer: Managed Care, Other (non HMO) | Admitting: Podiatry

## 2016-10-11 ENCOUNTER — Encounter: Payer: Self-pay | Admitting: Podiatry

## 2016-10-11 DIAGNOSIS — B351 Tinea unguium: Secondary | ICD-10-CM

## 2016-10-11 MED ORDER — TERBINAFINE HCL 250 MG PO TABS: 250.0000 mg | ORAL_TABLET | Freq: Every day | ORAL | 0 refills | Status: DC

## 2016-10-13 NOTE — Progress Notes (Signed)
Subjective:    Patient ID: Rowland LatheLisa T Buczynski, female   DOB: 56 y.o.   MRN: [id number]   HPI patient presents stating that she is here to review the results of her fungus and decide what might be appropriate treatment    ROS      Objective:  Physical Exam Neurovascular status intact with patient found to have positive culture for fungus of both feet within the nailbeds and also skin manifestations more left over right    Assessment:  Mycotic nail left over right       Plan:   H&P condition reviewed and recommended oral medication consisting of terbinafine going over possible complications and we are getting get the results of her liver function studies which are to be done this week. I gave her instructions this may or may not make a difference for her and hopefully she will show some improvement with medication. May require other treatment in future

## 2016-10-14 ENCOUNTER — Other Ambulatory Visit: Payer: Self-pay

## 2016-10-14 ENCOUNTER — Other Ambulatory Visit: Payer: Self-pay | Admitting: Obstetrics and Gynecology

## 2016-10-14 MED ORDER — TELMISARTAN 80 MG PO TABS: 80.0000 mg | ORAL_TABLET | Freq: Every day | ORAL | 1 refills | Status: DC

## 2016-10-19 LAB — CYTOLOGY - PAP

## 2016-12-05 ENCOUNTER — Other Ambulatory Visit: Payer: Self-pay | Admitting: Internal Medicine

## 2016-12-14 ENCOUNTER — Other Ambulatory Visit: Payer: Self-pay | Admitting: Obstetrics and Gynecology

## 2016-12-14 DIAGNOSIS — Z1231 Encounter for screening mammogram for malignant neoplasm of breast: Secondary | ICD-10-CM

## 2016-12-21 ENCOUNTER — Ambulatory Visit
Admission: RE | Admit: 2016-12-21 | Discharge: 2016-12-21 | Disposition: A | Discharge status: Home / Self Care | Payer: Managed Care, Other (non HMO) | Source: Ambulatory Visit | Attending: Obstetrics and Gynecology | Admitting: Obstetrics and Gynecology

## 2016-12-21 DIAGNOSIS — Z1231 Encounter for screening mammogram for malignant neoplasm of breast: Secondary | ICD-10-CM

## 2017-01-05 LAB — HM DIABETES EYE EXAM

## 2017-01-26 ENCOUNTER — Other Ambulatory Visit: Payer: Self-pay

## 2017-01-26 MED ORDER — TELMISARTAN 80 MG PO TABS: 40.0000 mg | ORAL_TABLET | Freq: Every day | ORAL | 0 refills | Status: DC

## 2017-04-01 ENCOUNTER — Encounter: Payer: Self-pay | Admitting: Internal Medicine

## 2017-04-01 ENCOUNTER — Ambulatory Visit (INDEPENDENT_AMBULATORY_CARE_PROVIDER_SITE_OTHER): Payer: Managed Care, Other (non HMO) | Admitting: Internal Medicine

## 2017-04-01 VITALS — BP 128/82 | HR 78 | Ht 64.0 in | Wt 175.0 lb

## 2017-04-01 DIAGNOSIS — E785 Hyperlipidemia, unspecified: Secondary | ICD-10-CM | POA: Diagnosis not present

## 2017-04-01 DIAGNOSIS — E119 Type 2 diabetes mellitus without complications: Secondary | ICD-10-CM | POA: Diagnosis not present

## 2017-04-01 DIAGNOSIS — E663 Overweight: Secondary | ICD-10-CM

## 2017-04-01 LAB — COMPLETE METABOLIC PANEL WITH GFR
AG Ratio: 1.7 (calc) (ref 1.0–2.5)
ALKALINE PHOSPHATASE (APISO): 58 U/L (ref 33–130)
ALT: 25 U/L (ref 6–29)
AST: 23 U/L (ref 10–35)
Albumin: 4.5 g/dL (ref 3.6–5.1)
BILIRUBIN TOTAL: 0.4 mg/dL (ref 0.2–1.2)
BUN: 16 mg/dL (ref 7–25)
CHLORIDE: 99 mmol/L (ref 98–110)
CO2: 29 mmol/L (ref 20–32)
CREATININE: 0.95 mg/dL (ref 0.50–1.05)
Calcium: 9.1 mg/dL (ref 8.6–10.4)
GFR, Est African American: 78 mL/min/{1.73_m2} (ref >=60)
GFR, Est Non African American: 67 mL/min/{1.73_m2} (ref >=60)
GLUCOSE: 157 mg/dL — AB (ref 65–99)
Globulin: 2.6 g/dL (calc) (ref 1.9–3.7)
Potassium: 3.3 mmol/L — ABNORMAL LOW (ref 3.5–5.3)
Sodium: 138 mmol/L (ref 135–146)
Total Protein: 7.1 g/dL (ref 6.1–8.1)

## 2017-04-01 LAB — MICROALBUMIN / CREATININE URINE RATIO
CREATININE, U: 29.2 mg/dL
MICROALB/CREAT RATIO: 2.4 mg/g (ref 0.0–30.0)

## 2017-04-01 LAB — POCT GLYCOSYLATED HEMOGLOBIN (HGB A1C): Hemoglobin A1C: 6

## 2017-04-01 LAB — LIPID PANEL
CHOL/HDL RATIO: 4
CHOLESTEROL: 200 mg/dL (ref 0–200)
HDL: 48.2 mg/dL (ref >39.00)
NonHDL: 151.36
TRIGLYCERIDES: 234 mg/dL — AB (ref 0.0–149.0)
VLDL: 46.8 mg/dL — AB (ref 0.0–40.0)

## 2017-04-01 LAB — LDL CHOLESTEROL, DIRECT: LDL DIRECT: 122 mg/dL

## 2017-04-01 NOTE — Addendum Note (Signed)
Addended by: Ann MakiBAILEY, MEGAN T on: 04/01/2017 02:54 PM   Modules accepted: Orders

## 2017-04-01 NOTE — Progress Notes (Addendum)
Patient ID: Elizabeth LatheLisa T Homewood, female   DOB: [birth year], 56 y.o.   MRN: [id number]  HPI: Elizabeth Ware is a 56 y.o.-year-old female, returning for f/u for DM2, prev GDM, dx in 05/2014, non-insulin-dependent, uncontrolled, without complications. Last visit 6 mo.  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  She is on Cipro for LLQ pain. Se has a h/o diverticulitis >> part of colon removed in 2008.  Last hemoglobin A1c was: Lab Results  Component Value Date   HGBA1C 6.2 10/01/2016   HGBA1C 6.2 09/19/2015   HGBA1C 6.1 06/23/2015   Pt is on a regimen of: - Glyxambi 25-5 moved in am - Glipizide XL 5 mg in am, before b'fast. Tried Metformin before >> nausea.  Pt checks her sugars seldom. From last visits: - am:  140-150 >> 104-118 >> 120s >> 101-120 - 2h after b'fast: n/c  - before lunch: n/c - 2h after lunch: n/c >> 172-200 >> 160 >> n/c - before dinner: n/c - 2h after dinner: n/c - bedtime: n/c - nighttime: n/c Lowest sugar was 101 >> 100; + hypoglycemia awareness - ? level Highest sugar was 120 >> 120  Glucometer: One Touch Verio  Pt's meals are: - Breakfast: 2 Eggos + fruit - Lunch: pita tuna; [employer]; yoghurt - Dinner: meat + veggies + spaghetti - Snacks: dill pickles, nuts, chocolate, potato chips, grapes She saw nutrition in te past >> it helped.  She is going to the gym 5-6x a week (body pump, zumba).  - No CKD, last BUN/creatinine:  Lab Results  Component Value Date   BUN 17 09/19/2015   CREATININE 0.74 09/19/2015  On Micardis 50 >> 25 mg now as BP decreased to low. - + HL; last set of lipids: Lab Results  Component Value Date   CHOL 178 09/19/2015   HDL 52.70 09/19/2015   LDLCALC 111 (H) 09/19/2015   TRIG 76.0 09/19/2015   CHOLHDL 3 09/19/2015   - last eye exam was in 12/2016 >> No DR. Dr. Lawerance BachBurns. - no numbness and tingling in her feet.  ROS: Constitutional: no weight gain/no weight loss, no fatigue, no subjective hyperthermia, no subjective  hypothermia Eyes: no blurry vision, no xerophthalmia ENT: no sore throat, no nodules palpated in throat, no dysphagia, no odynophagia, no hoarseness Cardiovascular: no CP/no SOB/no palpitations/no leg swelling Respiratory: no cough/no SOB/no wheezing Gastrointestinal: no N/no V/no D/no C/no acid reflux Musculoskeletal: no muscle aches/no joint aches Skin: no rashes, no hair loss Neurological: no tremors/no numbness/no tingling/no dizziness  I reviewed pt's medications, allergies, PMH, social hx, family hx, and changes were documented in the history of present illness. Otherwise, unchanged from my initial visit note.  Past Medical History:  Diagnosis Date  . Diabetes mellitus without complication (HCC)   . Diverticulitis   . Hypertension    Past Surgical History:  Procedure Laterality Date  . COLON SURGERY     History   Social History  . Marital Status: Married    Spouse Name: Elizabeth Ware  . Number of Children: 1   Occupational History  .    Social History Main Topics  . Smoking status: Never Smoker   . Smokeless tobacco: Not on file  . Alcohol Use: No  . Drug Use: No   Current Outpatient Medications on File Prior to Visit  Medication Sig Dispense Refill  . Empagliflozin-Linagliptin (GLYXAMBI) 25-5 MG TABS Take 1 tablet by mouth every morning. 90 tablet 3  . fluticasone (FLONASE) 50 MCG/ACT nasal  spray Place into both nostrils daily.    Marland Kitchen glipiZIDE (GLUCOTROL XL) 5 MG 24 hr tablet Take 1 tablet (5 mg total) by mouth daily with breakfast. 90 tablet 3  . glucose blood (ONETOUCH VERIO) test strip 1 each by Other route 2 (two) times daily. 180 each 1  . hydrochlorothiazide (HYDRODIURIL) 25 MG tablet Take 1 tablet (25 mg total) by mouth daily. 90 tablet 1  . hydrochlorothiazide (HYDRODIURIL) 25 MG tablet TAKE 1 TABLET (25 MG TOTAL) BY MOUTH DAILY. 90 tablet 3  . loratadine (CLARITIN) 10 MG tablet Take 1 tablet (10 mg total) by mouth daily. (Patient not taking: Reported on 06/23/2015)  100 tablet 3  . ONETOUCH DELICA LANCETS FINE MISC 1 Device by Does not apply route daily as needed. 50 each 3  . telmisartan (MICARDIS) 80 MG tablet Take 0.5 tablets (40 mg total) by mouth daily. Patient needs office visit before refills will be given 45 tablet 0  . terbinafine (LAMISIL) 250 MG tablet Take 1 tablet (250 mg total) by mouth daily. 90 tablet 0  . TRANXENE-T 7.5 MG tablet Take 1 tablet (7.5 mg total) by mouth 2 (two) times daily as needed for anxiety or sleep. 60 tablet 3   No current facility-administered medications on file prior to visit.    Allergies  Allergen Reactions  . Cetirizine Hcl     REACTION: fatigue  . Metformin And Related Nausea And Vomiting  . Penicillins Nausea And Vomiting  . Valsartan     REACTION: rash   Family History  Problem Relation Age of Onset  . Diabetes Mother   . Stroke Mother   . Cancer Father        colon or liver  Thyroid ds. And HTN in Mother.  PE: BP 128/82   Pulse 78   Ht 5\' 4"  (1.626 m)   Wt 175 lb (79.4 kg)   SpO2 97%   BMI 30.04 kg/m  Body mass index is 30.04 kg/m.  Wt Readings from Last 3 Encounters:  04/01/17 175 lb (79.4 kg)  10/01/16 175 lb (79.4 kg)  09/20/16 177 lb 0.6 oz (80.3 kg)   Constitutional: overweight, in NAD Eyes: PERRLA, EOMI, no exophthalmos ENT: moist mucous membranes, no thyromegaly, no cervical lymphadenopathy Cardiovascular: RRR, No MRG Respiratory: CTA B Gastrointestinal: abdomen soft, NT, ND, BS+ Musculoskeletal: no deformities, strength intact in all 4 Skin: moist, warm, no rashes Neurological: no tremor with outstretched hands, DTR normal in all 4  ASSESSMENT: 1. DM2, non-insulin-dependent, uncontrolled, without long term complications  2. Overweight  PLAN:  1. Patient with h/o controlled DM, on po DM regimen: DPP4 inhibitor + a SGLT2 R blocker + Glipizide XL 5 mg daily.  Seldom checking sugars but when she does she is usually at goal.  I again advised her to start checking sugars  consistently, once every day or every other day, rotating check times. - she goes to the gym almost every day to clear her mind  - no SEs of her meds - continue current regimen - I suggested to:  Patient Instructions  Please continue: - Glyxambi 25-5 mg daily before b'ast. - Glipizide XL 5 mg in am, before b'fast  Please return in 6 months with your sugar log.   - today, HbA1c is 6% (even better) - continue checking sugars at different times of the day - check 1x a day, rotating checks - advised for yearly eye exams >> she is UTD - will check annual labs today -  Return to clinic in 6 mo with sugar log   2. Overweight - goes to the gym more frequently now >> almost every day - did not lose weight since last visit, but no weight gain either - possible some mm mass  Office Visit on 04/01/2017  Component Date Value Ref Range Status  . Microalb, Ur 04/01/2017 <0.7  0.0 - 1.9 mg/dL Final  . Creatinine,U 16/10/960411/01/2017 29.2  mg/dL Final  . Microalb Creat Ratio 04/01/2017 2.4  0.0 - 30.0 mg/g Final  . Glucose, Bld 04/01/2017 157* 65 - 99 mg/dL Final   Comment: .            Fasting reference interval . For someone without known diabetes, a glucose value >125 mg/dL indicates that they may have diabetes and this should be confirmed with a follow-up test. .   . BUN 04/01/2017 16  7 - 25 mg/dL Final  . Creat 54/09/811911/01/2017 0.95  0.50 - 1.05 mg/dL Final   Comment: For patients >56 years of age, the reference limit for Creatinine is approximately 13% higher for people identified as African-American. .   . GFR, Est Non African American 04/01/2017 67  > OR = 60 mL/min/1.473m2 Final  . GFR, Est African American 04/01/2017 78  > OR = 60 mL/min/1.4773m2 Final  . BUN/Creatinine Ratio 04/01/2017 NOT APPLICABLE  6 - 22 (calc) Final  . Sodium 04/01/2017 138  135 - 146 mmol/L Final  . Potassium 04/01/2017 3.3* 3.5 - 5.3 mmol/L Final  . Chloride 04/01/2017 99  98 - 110 mmol/L Final  . CO2 04/01/2017 29   20 - 32 mmol/L Final  . Calcium 04/01/2017 9.1  8.6 - 10.4 mg/dL Final  . Total Protein 04/01/2017 7.1  6.1 - 8.1 g/dL Final  . Albumin 14/78/295611/01/2017 4.5  3.6 - 5.1 g/dL Final  . Globulin 21/30/865711/01/2017 2.6  1.9 - 3.7 g/dL (calc) Final  . AG Ratio 04/01/2017 1.7  1.0 - 2.5 (calc) Final  . Total Bilirubin 04/01/2017 0.4  0.2 - 1.2 mg/dL Final  . Alkaline phosphatase (APISO) 04/01/2017 58  33 - 130 U/L Final  . AST 04/01/2017 23  10 - 35 U/L Final  . ALT 04/01/2017 25  6 - 29 U/L Final  . Cholesterol 04/01/2017 200  0 - 200 mg/dL Final   ATP III Classification       Desirable:  < 200 mg/dL               Borderline High:  200 - 239 mg/dL          High:  > = 846240 mg/dL  . Triglycerides 04/01/2017 234.0* 0.0 - 149.0 mg/dL Final   Normal:  <962<150 mg/dLBorderline High:  150 - 199 mg/dL  . HDL 04/01/2017 48.20  >39.00 mg/dL Final  . VLDL 95/28/413211/01/2017 46.8* 0.0 - 40.0 mg/dL Final  . Total CHOL/HDL Ratio 04/01/2017 4   Final                  Men          Women1/2 Average Risk     3.4          3.3Average Risk          5.0          4.42X Average Risk          9.6          7.13X Average Risk          15.0  11.0                      . NonHDL 04/01/2017 151.36   Final   NOTE:  Non-HDL goal should be 30 mg/dL higher than patient's LDL goal (i.e. LDL goal of < 70 mg/dL, would have non-HDL goal of < 100 mg/dL)  . Hemoglobin A1C 04/01/2017 6.0   Final  . Direct LDL 04/01/2017 122.0  mg/dL Final   Optimal:  <161 mg/dLNear or Above Optimal:  100-129 mg/dLBorderline High:  130-159 mg/dLHigh:  160-189 mg/dLVery High:  >190 mg/dL   Labs are okay except slightly low potassium (will advise to start potassium rich foods), high blood sugar, and increased triglycerides and LDL.  She would benefit from a statin will discussed with patient.  Carlus Pavlov, MD PhD [facility]

## 2017-04-01 NOTE — Patient Instructions (Addendum)
Please continue: - Glyxambi 25-5 mg daily before b'ast. - Glipizide XL 5 mg in am, before b'fast  Please return in 6 months with your sugar log.

## 2017-04-05 ENCOUNTER — Telehealth: Payer: Self-pay

## 2017-04-05 ENCOUNTER — Other Ambulatory Visit: Payer: Self-pay | Admitting: Internal Medicine

## 2017-04-05 DIAGNOSIS — E119 Type 2 diabetes mellitus without complications: Secondary | ICD-10-CM

## 2017-04-05 NOTE — Telephone Encounter (Signed)
Called patient and advised of the options to recheck lab work, patient would like to come in a month and recheck fasting. I will reorder Lipid Panel, and I scheduled patient for lab appointment.

## 2017-04-05 NOTE — Telephone Encounter (Signed)
-----   Message from Cristina Gherghe, MD sent at 04/04/2017  5:25 PM EST ----- Tyera Hansley, can you please call pt: Labs are ok except slightly low potassium (please advise to start potassium rich foods - can search on google), high blood sugar, and increased triglycerides and LDL (bad cholesterol).  She would benefit from a statin -would she agree to start this?. 

## 2017-04-05 NOTE — Telephone Encounter (Signed)
Called patient and gave lab results. Patient would like to retest the Lipid panel. As those labs have never been high before, and she would like to double check before starting medication. Please let me know if this is okay, and we could retest.

## 2017-04-05 NOTE — Telephone Encounter (Signed)
-----   Message from Carlus Pavlovristina Gherghe, MD sent at 04/04/2017  5:25 PM EST ----- Raynelle FanningJulie, can you please call pt: Labs are ok except slightly low potassium (please advise to start potassium rich foods - can search on google), high blood sugar, and increased triglycerides and LDL (bad cholesterol).  She would benefit from a statin -would she agree to start this?.Marland Kitchen

## 2017-04-13 ENCOUNTER — Ambulatory Visit: Payer: Managed Care, Other (non HMO) | Admitting: Podiatry

## 2017-05-05 ENCOUNTER — Other Ambulatory Visit: Payer: Self-pay | Admitting: Internal Medicine

## 2017-05-05 DIAGNOSIS — R7989 Other specified abnormal findings of blood chemistry: Secondary | ICD-10-CM

## 2017-05-05 DIAGNOSIS — E876 Hypokalemia: Secondary | ICD-10-CM

## 2017-05-11 ENCOUNTER — Other Ambulatory Visit (INDEPENDENT_AMBULATORY_CARE_PROVIDER_SITE_OTHER): Payer: Managed Care, Other (non HMO)

## 2017-05-11 DIAGNOSIS — E876 Hypokalemia: Secondary | ICD-10-CM | POA: Diagnosis not present

## 2017-05-11 DIAGNOSIS — R7989 Other specified abnormal findings of blood chemistry: Secondary | ICD-10-CM

## 2017-05-11 LAB — LIPID PANEL
Cholesterol: 179 mg/dL (ref 0–200)
HDL: 49.3 mg/dL (ref >39.00)
LDL Cholesterol: 109 mg/dL — ABNORMAL HIGH (ref 0–99)
NONHDL: 129.64
Total CHOL/HDL Ratio: 4
Triglycerides: 104 mg/dL (ref 0.0–149.0)
VLDL: 20.8 mg/dL (ref 0.0–40.0)

## 2017-05-11 LAB — POTASSIUM: Potassium: 3.5 mEq/L (ref 3.5–5.1)

## 2017-06-14 ENCOUNTER — Other Ambulatory Visit: Payer: Self-pay | Admitting: Internal Medicine

## 2017-06-23 ENCOUNTER — Other Ambulatory Visit: Payer: Self-pay

## 2017-06-23 MED ORDER — GLUCOSE BLOOD VI STRP: ORAL_STRIP | 0 refills | Status: DC

## 2017-07-14 ENCOUNTER — Other Ambulatory Visit: Payer: Self-pay | Admitting: Gastroenterology

## 2017-07-14 ENCOUNTER — Ambulatory Visit
Admission: RE | Admit: 2017-07-14 | Discharge: 2017-07-14 | Disposition: A | Discharge status: Home / Self Care | Payer: Managed Care, Other (non HMO) | Source: Ambulatory Visit | Attending: Gastroenterology | Admitting: Gastroenterology

## 2017-07-14 DIAGNOSIS — R109 Unspecified abdominal pain: Secondary | ICD-10-CM

## 2017-07-14 MED ORDER — IOPAMIDOL (ISOVUE-300) INJECTION 61%
100.0000 mL | Freq: Once | INTRAVENOUS | Status: AC | PRN
  Administered 2017-07-14: 100 mL via INTRAVENOUS

## 2017-09-29 ENCOUNTER — Ambulatory Visit: Payer: Managed Care, Other (non HMO) | Admitting: Internal Medicine

## 2017-10-28 ENCOUNTER — Ambulatory Visit (INDEPENDENT_AMBULATORY_CARE_PROVIDER_SITE_OTHER): Payer: Managed Care, Other (non HMO) | Admitting: Internal Medicine

## 2017-10-28 ENCOUNTER — Encounter: Payer: Self-pay | Admitting: Internal Medicine

## 2017-10-28 VITALS — BP 100/70 | HR 75 | Ht 64.0 in | Wt 175.8 lb

## 2017-10-28 DIAGNOSIS — E119 Type 2 diabetes mellitus without complications: Secondary | ICD-10-CM

## 2017-10-28 DIAGNOSIS — E785 Hyperlipidemia, unspecified: Secondary | ICD-10-CM

## 2017-10-28 LAB — POCT GLYCOSYLATED HEMOGLOBIN (HGB A1C): HEMOGLOBIN A1C: 6.4 % — AB (ref 4.0–5.6)

## 2017-10-28 NOTE — Patient Instructions (Addendum)
Please move: - Glyxambi 25-5 mg daily before b'fast.  Continue: - Glipizide XL 5 mg in am, before lunch  Please return in 6 months with your sugar log

## 2017-10-28 NOTE — Progress Notes (Signed)
Patient ID: Elizabeth Ware, female   DOB: November 01, 1960, 57 y.o.   MRN: [id number]  HPI: Elizabeth Ware is a 57 y.o.-year-old female, returning for f/u for DM2, prev GDM, dx in 05/2014, non-insulin-dependent, uncontrolled, without complications. 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 She is in a SNF.  Last hemoglobin A1c was: Lab Results  Component Value Date   HGBA1C 6.0 04/01/2017   HGBA1C 6.2 10/01/2016   HGBA1C 6.2 09/19/2015   Pt is on a regimen of: - Glyxambi 25-5 mg daily before b'fast >> at night - Glipizide XL 5 mg in am, before b'fast >> lunchtime Tried Metformin before >> nausea.  She is checking CBGs seldom: - am:  104-118 >> 120s >> 101-120 >> 100-105 - 2h after b'fast: n/c  - before lunch: n/c - 2h after lunch: 172-200 >> 160 >> n/c - before dinner: n/c - 2h after dinner: n/c - bedtime: n/c >> 90-105 - nighttime: n/c Lowest sugar was 100 >> 90 ; it is unclear at which level she has hypoglycemia awareness Highest sugar was 120 >> 105.  Glucometer: One Touch Verio  Pt's meals are: - Breakfast: 2 Eggos + fruit - Lunch: pita tuna; [employer]; yoghurt - Dinner: meat + veggies + spaghetti - Snacks: dill pickles, nuts, chocolate, potato chips, grapes She saw nutrition in te past >> it helped.  She is going to the gym 6 times a week(body pump, zumba).  -No CKD, last BUN/creatinine:  Lab Results  Component Value Date   BUN 16 04/01/2017   CREATININE 0.95 04/01/2017  On Mycardis, now 40 mg as blood pressure decreased.  - + HL; last set of lipids was better: Lab Results  Component Value Date   CHOL 179 05/11/2017   HDL 49.30 05/11/2017   LDLCALC 109 (H) 05/11/2017   LDLDIRECT 122.0 04/01/2017   TRIG 104.0 05/11/2017   CHOLHDL 4 05/11/2017  Not on a statin.  - last eye exam was in  12/2016: No DR. Dr. Lawerance BachBurns.  - no numbness and tingling in her feet.  She has a h/o diverticulitis >> part of colon removed in 2008. No  treated for diverticulosis.   ROS: Constitutional: no weight gain/no weight loss, no fatigue, she had fever and chills during diverticulitis episode Eyes: no blurry vision, no xerophthalmia ENT: no sore throat, no nodules palpated in throat, no dysphagia, no odynophagia, no hoarseness Cardiovascular: no CP/no SOB/no palpitations/no leg swelling Respiratory: no cough/no SOB/no wheezing Gastrointestinal: no N/no V/no D/no C/no acid reflux Musculoskeletal: no muscle aches/no joint aches Skin: no rashes, no hair loss Neurological: no tremors/no numbness/no tingling/no dizziness  I reviewed pt's medications, allergies, PMH, social hx, family hx, and changes were documented in the history of present illness. Otherwise, unchanged from my initial visit note.  Past Medical History:  Diagnosis Date  . Diabetes mellitus without complication (HCC)   . Diverticulitis   . Hypertension    Past Surgical History:  Procedure Laterality Date  . COLON SURGERY     History   Social History  . Marital Status: Married    Spouse Name: N/A  . Number of Children: 1   Occupational History  .    Social History Main Topics  . Smoking status: Never Smoker   . Smokeless tobacco: Not on file  . Alcohol Use: No  . Drug Use: No   Current Outpatient Medications on File Prior to Visit  Medication Sig Dispense Refill  .  ciprofloxacin (CIPRO) 500 MG tablet     . clorazepate (TRANXENE) 7.5 MG tablet TAKE 1 TABLET BY MOUTH TWICE A DAY AS NEEDED FOR ANXIETY/SLEEP 60 tablet 3  . Empagliflozin-Linagliptin (GLYXAMBI) 25-5 MG TABS Take 1 tablet by mouth every morning. 90 tablet 3  . fluticasone (FLONASE) 50 MCG/ACT nasal spray Place into both nostrils daily.    Marland Kitchen glipiZIDE (GLUCOTROL XL) 5 MG 24 hr tablet Take 1 tablet (5 mg total) by mouth daily with breakfast. 90 tablet 3  . glucose blood (ACCU-CHEK AVIVA) test strip Use to test 2 (TWO) times daily 200 each 0  . hydrochlorothiazide (HYDRODIURIL) 25 MG tablet  Take 1 tablet (25 mg total) by mouth daily. 90 tablet 1  . loratadine (CLARITIN) 10 MG tablet Take 1 tablet (10 mg total) by mouth daily. (Patient not taking: Reported on 06/23/2015) 100 tablet 3  . ONETOUCH DELICA LANCETS FINE MISC 1 Device by Does not apply route daily as needed. 50 each 3  . pantoprazole (PROTONIX) 40 MG tablet     . telmisartan (MICARDIS) 80 MG tablet Take 0.5 tablets (40 mg total) by mouth daily. Patient needs office visit before refills will be given 45 tablet 0  . terbinafine (LAMISIL) 250 MG tablet Take 1 tablet (250 mg total) by mouth daily. 90 tablet 0   No current facility-administered medications on file prior to visit.    Allergies  Allergen Reactions  . Cetirizine Hcl     REACTION: fatigue  . Metformin And Related Nausea And Vomiting  . Penicillins Nausea And Vomiting  . Valsartan     REACTION: rash   Family History  Problem Relation Age of Onset  . Diabetes Mother   . Stroke Mother   . Cancer Father        colon or liver  Thyroid ds. And HTN in Mother.  PE: BP 100/70   Pulse 75   Ht 5\' 4"  (1.626 m)   Wt 175 lb 12.8 oz (79.7 kg)   SpO2 97%   BMI 30.18 kg/m  Body mass index is 30.18 kg/m.  Wt Readings from Last 3 Encounters:  10/28/17 175 lb 12.8 oz (79.7 kg)  04/01/17 175 lb (79.4 kg)  10/01/16 175 lb (79.4 kg)   Constitutional: overweight, in NAD Eyes: PERRLA, EOMI, no exophthalmos ENT: moist mucous membranes, no thyromegaly, no cervical lymphadenopathy Cardiovascular: RRR, No MRG Respiratory: CTA B Gastrointestinal: abdomen soft, NT, ND, BS+ Musculoskeletal: no deformities, strength intact in all 4 Skin: moist, warm, no rashes Neurological: no tremor with outstretched hands, DTR normal in all 4  ASSESSMENT: 1. DM2, non-insulin-dependent, uncontrolled, without long term complications  2. HL  PLAN:  1. Patient with history of controlled diabetes, on oral antidiabetic regimen with DPP 4 inhibitor, SGLT2 blocker and glipizide XL.   No side effects from her medications.  Her sugars are usually at goal, but she was not checking she is still not checking sugars consistently her sugars consistently before so I strongly advised her to start taking once a day or every other day, rotating check times.  She is still not checking sugars as she should.  Last check was 3 weeks ago.  I gave her another meter today to keep at her work. - At this visit, she is telling me that she moved her medications around so she is now taking the glipizide before lunch and Glyxambi at night.  We discussed the Glyxambi is ideally taken in the morning.  We will try to move  it then. - She continues to go to the gym and exercise consistently.   - Again discussed about the value  of a more plant-based diet and her diabetes control  - I suggested to:  Patient Instructions  Please move: - Glyxambi 25-5 mg daily before b'fast.  Continue: - Glipizide XL 5 mg in am, before lunch  Please return in 6 months with your sugar log  - today, HbA1c is 6.4% (higher) - continue checking sugars at different times of the day - check 1x a day, rotating checks - advised for yearly eye exams >> she is UTD - Return to clinic in 3 mo with sugar log   2. HL -  At last visit, lipids were slightly worse so I suggested to add a statin. However, she wanted to have the lipid panel repeated before starting the statin >> repeat Lipid panel was better   Carlus Pavlov, MD PhD [facility] Endocrin[facility]

## 2017-10-28 NOTE — Addendum Note (Signed)
Addended by: Yolande JollyLAWSON, Jennea Rager on: 10/28/2017 01:12 PM   Modules accepted: Orders

## 2017-11-29 ENCOUNTER — Other Ambulatory Visit: Payer: Self-pay

## 2017-11-29 ENCOUNTER — Ambulatory Visit (INDEPENDENT_AMBULATORY_CARE_PROVIDER_SITE_OTHER): Payer: Managed Care, Other (non HMO)

## 2017-11-29 ENCOUNTER — Ambulatory Visit (INDEPENDENT_AMBULATORY_CARE_PROVIDER_SITE_OTHER): Payer: Managed Care, Other (non HMO) | Admitting: Podiatry

## 2017-11-29 ENCOUNTER — Encounter: Payer: Self-pay | Admitting: Podiatry

## 2017-11-29 ENCOUNTER — Other Ambulatory Visit: Payer: Self-pay | Admitting: Podiatry

## 2017-11-29 DIAGNOSIS — B351 Tinea unguium: Secondary | ICD-10-CM

## 2017-11-29 DIAGNOSIS — M779 Enthesopathy, unspecified: Secondary | ICD-10-CM

## 2017-11-29 DIAGNOSIS — M778 Other enthesopathies, not elsewhere classified: Secondary | ICD-10-CM

## 2017-11-29 DIAGNOSIS — M79671 Pain in right foot: Secondary | ICD-10-CM

## 2017-11-29 MED ORDER — TRIAMCINOLONE ACETONIDE 10 MG/ML IJ SUSP
10.0000 mg | Freq: Once | INTRAMUSCULAR | Status: AC
  Administered 2017-11-29: 10 mg

## 2017-11-29 MED ORDER — TERBINAFINE HCL 250 MG PO TABS: 250.0000 mg | ORAL_TABLET | Freq: Every day | ORAL | 0 refills | Status: DC

## 2017-11-30 NOTE — Progress Notes (Signed)
Subjective:   Patient ID: Elizabeth Ware, Elizabeth Ware   DOB: 57 y.o.   MRN: [id number]   HPI Patient presents stating she developed a lot of pain in the bottom of her right foot this weekend and she is a very active person and does not know what might of occurred or why this happened   ROS      Objective:  Physical Exam  Neurovascular status intact with patient's right first MPJ showing inflammation and discomfort around the medial side.  There is some enlargement of the bone structure but it is localized with no other pathology noted     Assessment:  Inflammatory capsulitis first MPJ right and into the plantar insertion of the fascia into the first metatarsal     Plan:  H&P condition reviewed discussed this condition and her nail condition and we will start her again on an antifungal as she admits she did not take it the last time of the prescription is expired.  She has had a recent liver function which was normal.  I then went ahead and reviewed her x-rays and I carefully injected the plantar distal fascia and around the first MPJ capsule 3 mg Kenalog 5 mg Xylocaine and she will be seen back to reevaluate.  X-ray indicates that there is some enlargement of the tissue around the first MPJ that is localized with no indication of bony injury

## 2017-12-09 ENCOUNTER — Other Ambulatory Visit: Payer: Self-pay | Admitting: Internal Medicine

## 2017-12-10 ENCOUNTER — Other Ambulatory Visit: Payer: Self-pay | Admitting: Internal Medicine

## 2017-12-25 ENCOUNTER — Other Ambulatory Visit: Payer: Self-pay | Admitting: Internal Medicine

## 2017-12-30 ENCOUNTER — Telehealth: Payer: Self-pay | Admitting: Internal Medicine

## 2017-12-30 NOTE — Telephone Encounter (Signed)
Copied from CRM [id number]. Topic: Quick Communication - See Telephone Encounter >> Dec 30, 2017  1:13 PM Lorrine KinMcGee, Albi Rappaport B, [place] wrote: CRM for notification. See Telephone encounter for: 12/30/17. Patient calling and is requesting a 90 day supply of telmisartan (MICARDIS) 80 MG tablet be sent to the pharmacy. States that her insurance covers 90 days, not 30. Was not aware that she needed an appointment to get more refills/90 day supply. Scheduled for med refill on 02/03/18. [id number] - [place],  - [address]

## 2017-12-30 NOTE — Telephone Encounter (Signed)
Pls advise if ok to send 90 day...Shearon Stalls/lb

## 2017-12-31 NOTE — Telephone Encounter (Signed)
Ok #90 w/3 ref Thx 

## 2018-01-02 NOTE — Telephone Encounter (Signed)
MD sent rx to [facility] 

## 2018-01-03 ENCOUNTER — Other Ambulatory Visit: Payer: Self-pay | Admitting: Obstetrics and Gynecology

## 2018-01-03 DIAGNOSIS — Z1231 Encounter for screening mammogram for malignant neoplasm of breast: Secondary | ICD-10-CM

## 2018-01-04 ENCOUNTER — Other Ambulatory Visit: Payer: Self-pay

## 2018-01-04 MED ORDER — TELMISARTAN 80 MG PO TABS: 40.0000 mg | ORAL_TABLET | Freq: Every day | ORAL | 0 refills | Status: DC

## 2018-01-11 ENCOUNTER — Other Ambulatory Visit: Payer: Self-pay | Admitting: Internal Medicine

## 2018-01-26 ENCOUNTER — Encounter: Payer: Self-pay | Admitting: Internal Medicine

## 2018-01-26 ENCOUNTER — Ambulatory Visit (INDEPENDENT_AMBULATORY_CARE_PROVIDER_SITE_OTHER): Payer: Managed Care, Other (non HMO) | Admitting: Internal Medicine

## 2018-01-26 VITALS — BP 118/78 | HR 88 | Temp 98.4°F | Ht 64.0 in | Wt 172.0 lb

## 2018-01-26 DIAGNOSIS — J019 Acute sinusitis, unspecified: Secondary | ICD-10-CM | POA: Diagnosis not present

## 2018-01-26 DIAGNOSIS — I10 Essential (primary) hypertension: Secondary | ICD-10-CM | POA: Diagnosis not present

## 2018-01-26 DIAGNOSIS — J310 Chronic rhinitis: Secondary | ICD-10-CM | POA: Diagnosis not present

## 2018-01-26 MED ORDER — TELMISARTAN 80 MG PO TABS: 40.0000 mg | ORAL_TABLET | Freq: Every day | ORAL | 1 refills | Status: DC

## 2018-01-26 MED ORDER — LEVOFLOXACIN 500 MG PO TABS: 500.0000 mg | ORAL_TABLET | Freq: Every day | ORAL | 0 refills | Status: AC

## 2018-01-26 NOTE — Assessment & Plan Note (Signed)
Mild to mod, for antibx course,  to f/u any worsening symptoms or concerns 

## 2018-01-26 NOTE — Patient Instructions (Addendum)
Please take all new medication as prescribed - the antibiotic  You can also take Mucinex (or it's generic off brand) for congestion, and tylenol as needed for pain.  Please continue all other medications as before, and refills have been done if requested - the telmisartan.  Please have the pharmacy call with any other refills you may need.  Please continue your efforts at being more active, low cholesterol diet, and weight control.  Please keep your appointments with your specialists as you may have planned  Please return in 6 months, or sooner if needed, to Dr Posey Rea

## 2018-01-26 NOTE — Progress Notes (Signed)
Subjective:    Patient ID: Elizabeth Ware, female    DOB: 25-Jan-1961, 57 y.o.   MRN: [id number]  HPI   Here with 2-3 days acute onset fever, facial pain, pressure, headache, general weakness and malaise, and greenish d/c, with mild ST and cough, but pt denies chest pain, wheezing, increased sob or doe, orthopnea, PND, increased LE swelling, palpitations, dizziness or syncope.  Does have several wks ongoing nasal allergy symptoms with clearish congestion, itch and sneezing, without fever, pain, ST, cough, swelling or wheezing, but better with taking the flonase recently   Pt denies polydipsia, polyuria Past Medical History:  Diagnosis Date  . Diabetes mellitus without complication (HCC)   . Diverticulitis   . Hypertension    Past Surgical History:  Procedure Laterality Date  . COLON SURGERY      reports that she has never smoked. She has never used smokeless tobacco. She reports that she does not drink alcohol or use drugs. family history includes Cancer in her father; Diabetes in her mother; Stroke in her mother. Allergies  Allergen Reactions  . Cetirizine Hcl     REACTION: fatigue  . Metformin And Related Nausea And Vomiting  . Penicillins Nausea And Vomiting  . Valsartan     REACTION: rash   Current Outpatient Medications on File Prior to Visit  Medication Sig Dispense Refill  . ciprofloxacin (CIPRO) 500 MG tablet     . clorazepate (TRANXENE) 7.5 MG tablet TAKE 1 TABLET BY MOUTH TWICE A DAY AS NEEDED FOR ANXIETY/SLEEP 60 tablet 3  . fluticasone (FLONASE) 50 MCG/ACT nasal spray Place into both nostrils daily.    Marland Kitchen glipiZIDE (GLUCOTROL XL) 5 MG 24 hr tablet TAKE 1 TABLET (5 MG TOTAL) BY MOUTH DAILY WITH BREAKFAST. 90 tablet 3  . glucose blood (ACCU-CHEK AVIVA) test strip Use to test 2 (TWO) times daily 200 each 0  . GLYXAMBI 25-5 MG TABS TAKE 1 TABLET BY MOUTH EVERY DAY IN THE MORNING 90 tablet 3  . hydrochlorothiazide (HYDRODIURIL) 25 MG tablet TAKE 1 TABLET (25 MG TOTAL) BY MOUTH  DAILY. 90 tablet 3  . Influenza Vac Subunit Quad (FLUCELVAX QUADRIVALENT) 0.5 ML SUSY Flucelvax Quad 2018-2019 (PF) 60 mcg (15 mcg x 4)/0.5 mL IM syringe  TO BE ADMINISTERED BY PHARMACIST FOR IMMUNIZATION    . ONETOUCH DELICA LANCETS FINE MISC 1 Device by Does not apply route daily as needed. 50 each 3  . pantoprazole (PROTONIX) 40 MG tablet     . terbinafine (LAMISIL) 250 MG tablet Take 1 tablet (250 mg total) by mouth daily. 90 tablet 0  . terbinafine (LAMISIL) 250 MG tablet Take 1 tablet (250 mg total) by mouth daily. 90 tablet 0  . loratadine (CLARITIN) 10 MG tablet Take 1 tablet (10 mg total) by mouth daily. (Patient not taking: Reported on 06/23/2015) 100 tablet 3   No current facility-administered medications on file prior to visit.    Review of Systems  Constitutional: Negative for other unusual diaphoresis or sweats HENT: Negative for ear discharge or swelling Eyes: Negative for other worsening visual disturbances Respiratory: Negative for stridor or other swelling  Gastrointestinal: Negative for worsening distension or other blood Genitourinary: Negative for retention or other urinary change Musculoskeletal: Negative for other MSK pain or swelling Skin: Negative for color change or other new lesions Neurological: Negative for worsening tremors and other numbness  Psychiatric/Behavioral: Negative for worsening agitation or other fatigue All other system neg per pt    Objective:   Physical  Exam BP 118/78   Pulse 88   Temp 98.4 F (36.9 C) (Oral)   Ht 5\' 4"  (1.626 m)   Wt 172 lb (78 kg)   SpO2 96%   BMI 29.52 kg/m  VS noted,  Constitutional: Pt appears in NAD HENT: Head: NCAT.  Right Ear: External ear normal.  Left Ear: External ear normal.  Bilat tm's with mild erythema.  Max sinus areas mild tender.  Pharynx with mild erythema, no exudate Eyes: . Pupils are equal, round, and reactive to light. Conjunctivae and EOM are normal Nose: without d/c or deformity Neck: Neck  supple. Gross normal ROM Cardiovascular: Normal rate and regular rhythm.   Pulmonary/Chest: Effort normal and breath sounds without rales or wheezing.  Neurological: Pt is alert. At baseline orientation, motor grossly intact Skin: Skin is warm. No rashes, other new lesions, no LE edema Psychiatric: Pt behavior is normal without agitation , 1+ nervous No other exam findings    Assessment & Plan:

## 2018-01-26 NOTE — Assessment & Plan Note (Signed)
Stable, to cont flonase

## 2018-01-26 NOTE — Assessment & Plan Note (Signed)
stable overall by history and exam, recent data reviewed with pt, and pt to continue medical treatment as before,  to f/u any worsening symptoms or concerns  

## 2018-01-31 ENCOUNTER — Ambulatory Visit
Admission: RE | Admit: 2018-01-31 | Discharge: 2018-01-31 | Disposition: A | Discharge status: Home / Self Care | Payer: Managed Care, Other (non HMO) | Source: Ambulatory Visit | Attending: Obstetrics and Gynecology | Admitting: Obstetrics and Gynecology

## 2018-01-31 DIAGNOSIS — Z1231 Encounter for screening mammogram for malignant neoplasm of breast: Secondary | ICD-10-CM

## 2018-02-01 ENCOUNTER — Other Ambulatory Visit: Payer: Self-pay | Admitting: Internal Medicine

## 2018-02-02 NOTE — Telephone Encounter (Signed)
Please advise about switch

## 2018-02-03 ENCOUNTER — Ambulatory Visit: Payer: Managed Care, Other (non HMO) | Admitting: Internal Medicine

## 2018-02-06 MED ORDER — OLMESARTAN MEDOXOMIL 40 MG PO TABS: 40.0000 mg | ORAL_TABLET | Freq: Every day | ORAL | 3 refills | Status: DC

## 2018-02-06 NOTE — Addendum Note (Signed)
Addended by: Scarlett PrestoFRIEDENBACH, Michaelene Dutan on: 02/06/2018 09:42 AM   Modules accepted: Orders

## 2018-02-16 ENCOUNTER — Other Ambulatory Visit: Payer: Self-pay

## 2018-02-16 MED ORDER — GLUCOSE BLOOD VI STRP: ORAL_STRIP | 0 refills | Status: DC

## 2018-03-15 ENCOUNTER — Telehealth: Payer: Self-pay | Admitting: Internal Medicine

## 2018-03-15 NOTE — Telephone Encounter (Signed)
Patient is needing her test strips sent into the pharmacy She did not know if they were the [facility]. She just knows that it was whatever Dr Elvera Lennox sent in at her last visit.       [id number] [place], [place] - [address]

## 2018-03-16 MED ORDER — GLUCOSE BLOOD VI STRP: ORAL_STRIP | 1 refills | Status: DC

## 2018-03-16 MED ORDER — GLUCOSE BLOOD VI STRP: ORAL_STRIP | 0 refills | Status: DC

## 2018-03-16 NOTE — Telephone Encounter (Addendum)
RX sent.  Notified patient of message from Dr. Gherghe, patient expressed understanding and agreement. No further questions.  

## 2018-03-16 NOTE — Addendum Note (Signed)
Addended by: Darliss Ridgel I on: 03/16/2018 04:53 PM   Modules accepted: Orders

## 2018-04-08 ENCOUNTER — Other Ambulatory Visit: Payer: Self-pay | Admitting: Internal Medicine

## 2018-04-27 ENCOUNTER — Ambulatory Visit (INDEPENDENT_AMBULATORY_CARE_PROVIDER_SITE_OTHER): Payer: Managed Care, Other (non HMO) | Admitting: Family Medicine

## 2018-04-27 ENCOUNTER — Encounter: Payer: Self-pay | Admitting: Family Medicine

## 2018-04-27 ENCOUNTER — Ambulatory Visit: Payer: Managed Care, Other (non HMO) | Admitting: Internal Medicine

## 2018-04-27 VITALS — BP 110/78 | HR 80 | Temp 97.9°F | Ht 64.0 in | Wt 172.1 lb

## 2018-04-27 DIAGNOSIS — J0110 Acute frontal sinusitis, unspecified: Secondary | ICD-10-CM

## 2018-04-27 MED ORDER — DOXYCYCLINE HYCLATE 100 MG PO TABS: 100.0000 mg | ORAL_TABLET | Freq: Two times a day (BID) | ORAL | 0 refills | Status: DC

## 2018-04-27 NOTE — Progress Notes (Signed)
Patient ID: Elizabeth Ware, female   DOB: March 12, 1961, 57 y.o.   MRN: [id number] PCP: Tresa GarterPlotnikov, Aleksei V, MD  Subjective:  Elizabeth Ware is a 57 y.o. year old very pleasant female patient who presents with symptoms including nasal congestion,  ear pressure/pain, post nasal drip, and sinus pressure  -started: 5 days ago , symptoms are not improving  -previous treatments: She took a "leftover levaquin" yesterday. Robitussin and advil have provided some benefit.  -sick contacts/travel/risks: denies flu exposure or recent sick contact exposure. 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   -Hx of: allergies   ROS-denies fever, SOB, NVD, tooth pain  Pertinent Past Medical History- remote history of asthmatic bronchitis  Medications- reviewed  Current Outpatient Medications  Medication Sig Dispense Refill  . clorazepate (TRANXENE) 7.5 MG tablet TAKE 1 TABLET BY MOUTH TWICE A DAY AS NEEDED FOR ANXIETY/SLEEP 60 tablet 3  . fluticasone (FLONASE) 50 MCG/ACT nasal spray Place into both nostrils daily.    Marland Kitchen. glipiZIDE (GLUCOTROL XL) 5 MG 24 hr tablet TAKE 1 TABLET (5 MG TOTAL) BY MOUTH DAILY WITH BREAKFAST. 90 tablet 3  . glucose blood (ONETOUCH VERIO) test strip Use to check blood sugar three times a day 300 each 1  . GLYXAMBI 25-5 MG TABS TAKE 1 TABLET BY MOUTH EVERY DAY IN THE MORNING 90 tablet 3  . hydrochlorothiazide (HYDRODIURIL) 25 MG tablet TAKE 1 TABLET (25 MG TOTAL) BY MOUTH DAILY. 90 tablet 3  . Influenza Vac Subunit Quad (FLUCELVAX QUADRIVALENT) 0.5 ML SUSY Flucelvax Quad 2018-2019 (PF) 60 mcg (15 mcg x 4)/0.5 mL IM syringe  TO BE ADMINISTERED BY PHARMACIST FOR IMMUNIZATION    . olmesartan (BENICAR) 40 MG tablet Take 1 tablet (40 mg total) by mouth daily. 90 tablet 3  . ONETOUCH DELICA LANCETS FINE MISC 1 Device by Does not apply route daily as needed. 50 each 3  . pantoprazole (PROTONIX) 40 MG tablet     . telmisartan  (MICARDIS) 80 MG tablet Take 0.5 tablets (40 mg total) by mouth daily. Overdue for yearly physical w/labs must see provider for refills 15 tablet 0  . terbinafine (LAMISIL) 250 MG tablet Take 1 tablet (250 mg total) by mouth daily. 90 tablet 0  . terbinafine (LAMISIL) 250 MG tablet Take 1 tablet (250 mg total) by mouth daily. 90 tablet 0  . loratadine (CLARITIN) 10 MG tablet Take 1 tablet (10 mg total) by mouth daily. (Patient not taking: Reported on 06/23/2015) 100 tablet 3   No current facility-administered medications for this visit.     Objective: BP 110/78 (BP Location: Left Arm, Patient Position: Sitting, Cuff Size: Normal)   Pulse 80   Temp 97.9 F (36.6 C) (Oral)   Ht 5\' 4"  (1.626 m)   Wt 172 lb 1.3 oz (78.1 kg)   SpO2 97%   BMI 29.54 kg/m  Gen: NAD, resting comfortably HEENT: Turbinates mildly erythematous, TMs normal bilaterally, oropharynx is clear and moist, + frontal sinus tenderness CV: RRR no murmurs rubs or gallops Lungs: CTAB no crackles, wheeze, rhonchi Abdomen: soft/nontender/nondistended/normal bowel sounds. No rebound or guarding.  Ext: no edema Skin: warm, dry, no rash Neuro: grossly normal, moves all extremities  Assessment/Plan:  1. Acute frontal sinusitis, recurrence not specified Patient's symptoms most consistent with sinusitis. Discussed likely viral in nature and less likely bacterial cause. Frontal sinus pressure present with symptoms that have been present for 5  days. Discussed possible treatment options and advised use of Mucinex and flonase with symptoms. Advised rest and tylenol as needed. Patient continued to state that she needed levaquin for symptoms and I advised her that presentation today does not indicate use of this medication. After further discussion, advised patient to treat symptoms conservatively and if no improvement she will start doxycycline for symptoms. Advised that if her symptoms were to worsens she should let us know. Return  precautions provided.    - doxycycline (VIBRA-TABS) 100 MG tablet; Take 1 tablet (100 mg total) by mouth 2 (two) times daily.  Dispense: 20 tablet; Refill: 0  Finally, we reviewed reasons to return to care including if symptoms worsen or persist or new concerns arise- once again particularly shortness of breath or fever.  Inez Catalina, FNP

## 2018-04-27 NOTE — Patient Instructions (Signed)
Your symptoms are most likely related to a viral illness. Please drink plenty of water so that your urine is pale yellow or clear. Also, get plenty of rest, use tylenol or ibuprofen as needed for discomfort and follow up if symptoms do not improve in 3 to 4 days, worsen, or you develop a fever >101.  You have an antibiotic to take if symptoms are not improving in 2 to 3 days.   Feel better soon!   Sinusitis, Adult Sinusitis is soreness and inflammation of your sinuses. Sinuses are hollow spaces in the bones around your face. They are located:  Around your eyes.  In the middle of your forehead.  Behind your nose.  In your cheekbones.  Your sinuses and nasal passages are lined with a stringy fluid (mucus). Mucus normally drains out of your sinuses. When your nasal tissues get inflamed or swollen, the mucus can get trapped or blocked so air cannot flow through your sinuses. This lets bacteria, viruses, and funguses grow, and that leads to infection. Follow these instructions at home: Medicines  Take, use, or apply over-the-counter and prescription medicines only as told by your doctor. These may include nasal sprays.  If you were prescribed an antibiotic medicine, take it as told by your doctor. Do not stop taking the antibiotic even if you start to feel better. Hydrate and Humidify  Drink enough water to keep your pee (urine) clear or pale yellow.  Use a cool mist humidifier to keep the humidity level in your home above 50%.  Breathe in steam for 10-15 minutes, 3-4 times a day or as told by your doctor. You can do this in the bathroom while a hot shower is running.  Try not to spend time in cool or dry air. Rest  Rest as much as possible.  Sleep with your head raised (elevated).  Make sure to get enough sleep each night. General instructions  Put a warm, moist washcloth on your face 3-4 times a day or as told by your doctor. This will help with discomfort.  Wash your hands  often with soap and water. If there is no soap and water, use hand sanitizer.  Do not smoke. Avoid being around people who are smoking (secondhand smoke).  Keep all follow-up visits as told by your doctor. This is important. Contact a doctor if:  You have a fever.  Your symptoms get worse.  Your symptoms do not get better within 10 days. Get help right away if:  You have a very bad headache.  You cannot stop throwing up (vomiting).  You have pain or swelling around your face or eyes.  You have trouble seeing.  You feel confused.  Your neck is stiff.  You have trouble breathing. This information is not intended to replace advice given to you by your health care provider. Make sure you discuss any questions you have with your health care provider. Document Released: 10/27/2007 Document Revised: 01/04/2016 Document Reviewed: 03/05/2015 Elsevier Interactive Patient Education  [employer]2018 [employer]

## 2018-05-05 ENCOUNTER — Encounter: Payer: Self-pay | Admitting: Internal Medicine

## 2018-05-05 ENCOUNTER — Ambulatory Visit (INDEPENDENT_AMBULATORY_CARE_PROVIDER_SITE_OTHER): Payer: Managed Care, Other (non HMO) | Admitting: Internal Medicine

## 2018-05-05 VITALS — BP 102/60 | HR 88 | Ht 64.0 in | Wt 173.0 lb

## 2018-05-05 DIAGNOSIS — E663 Overweight: Secondary | ICD-10-CM

## 2018-05-05 DIAGNOSIS — R7989 Other specified abnormal findings of blood chemistry: Secondary | ICD-10-CM

## 2018-05-05 DIAGNOSIS — E119 Type 2 diabetes mellitus without complications: Secondary | ICD-10-CM | POA: Diagnosis not present

## 2018-05-05 LAB — POCT GLYCOSYLATED HEMOGLOBIN (HGB A1C): HEMOGLOBIN A1C: 6.4 % — AB (ref 4.0–5.6)

## 2018-05-05 NOTE — Progress Notes (Signed)
Patient ID: Cassia T Brickey, female   DOB: 12/10/1960, 1557 y[address]land Lathe.o.   MRN: [id number]  HPI: Rowland LatheLisa T Markus is a 57 y.o.-year-old female, returning for f/u for DM2, prev GDM, dx in 05/2014, non-insulin-dependent, uncontrolled, without complications. Last visit 6 months ago.  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  She is doing well.  Last hemoglobin A1c was: Lab Results  Component Value Date   HGBA1C 6.4 (A) 10/28/2017   HGBA1C 6.0 04/01/2017   HGBA1C 6.2 10/01/2016   Pt is on a regimen of: - Glyxambi 25-5 mg daily before b'fast >> at night >> before breakfast >> moved it at bedtime or before dinner - Glipizide XL 5 mg in am, before b'fast >> lunchtime >> moved it at bedtime or before dinner Tried Metformin before >> nausea.  She is checking CBGs 1x a day: - am: 120s >> 101-120 >> 100-105 >> 100-110 - 2h after b'fast: n/c  - before lunch: n/c - 2h after lunch: 172-200 >> 160 >> n/c - before dinner: n/c - 2h after dinner: n/c - bedtime: n/c >> 90-105 >> 108-125 - nighttime: n/c Lowest sugar was 100 >> 90 >> 90s; it is unclear at which level she has hypoglycemia awareness. Highest sugar was 120 >> 105 >> 125.  Glucometer: One Touch Verio  Pt's meals are: - Breakfast: 2 Eggos + fruit - Lunch: pita tuna; [employer]; yoghurt - Dinner: meat + veggies + spaghetti - Snacks: dill pickles, nuts, chocolate, potato chips, grapes She saw nutrition in the past: Helped  Continues to exercise at the gym: Body pump, Zumba.  She did not go in the last few weeks due to URI.  -No CKD, last BUN/creatinine:  Lab Results  Component Value Date   BUN 16 04/01/2017   CREATININE 0.95 04/01/2017  On Olmesartan.  -+ HL; latest set of lipids: Lab Results  Component Value Date   CHOL 179 05/11/2017   HDL 49.30 05/11/2017   LDLCALC 109 (H) 05/11/2017   LDLDIRECT 122.0 04/01/2017   TRIG 104.0 05/11/2017   CHOLHDL 4 05/11/2017  We started a statin last year, but she is not taking it.  - last eye  exam was in 12/2017: No DR. Dr. Lawerance BachBurns in [facility].  -No numbness and tingling in her feet.  She has a h/o diverticulitis >> part of colon removed in 2008.  ROS: Constitutional: no weight gain/no weight loss, no fatigue, no subjective hyperthermia, no subjective hypothermia Eyes: no blurry vision, no xerophthalmia ENT: no sore throat, no nodules palpated in neck, no dysphagia, no odynophagia, no hoarseness Cardiovascular: no CP/no SOB/no palpitations/no leg swelling Respiratory: no cough/no SOB/no wheezing Gastrointestinal: no N/no V/no D/no C/no acid reflux Musculoskeletal: no muscle aches/no joint aches Skin: no rashes, no hair loss Neurological: no tremors/no numbness/no tingling/no dizziness  I reviewed pt's medications, allergies, PMH, social hx, family hx, and changes were documented in the history of present illness. Otherwise, unchanged from my initial visit note.  Past Medical History:  Diagnosis Date  . Diabetes mellitus without complication (HCC)   . Diverticulitis   . Hypertension    Past Surgical History:  Procedure Laterality Date  . COLON SURGERY     History   Social History  . Marital Status: Married    Spouse Name: N/A  . Number of Children: 1   Occupational History  .    Social History Main Topics  . Smoking status: Never Smoker   . Smokeless tobacco: Not on  file  . Alcohol Use: No  . Drug Use: No   Current Outpatient Medications on File Prior to Visit  Medication Sig Dispense Refill  . clorazepate (TRANXENE) 7.5 MG tablet TAKE 1 TABLET BY MOUTH TWICE A DAY AS NEEDED FOR ANXIETY/SLEEP 60 tablet 3  . doxycycline (VIBRA-TABS) 100 MG tablet Take 1 tablet (100 mg total) by mouth 2 (two) times daily. 20 tablet 0  . fluticasone (FLONASE) 50 MCG/ACT nasal spray Place into both nostrils daily.    Marland Kitchen glipiZIDE (GLUCOTROL XL) 5 MG 24 hr tablet TAKE 1 TABLET (5 MG TOTAL) BY MOUTH DAILY WITH BREAKFAST. 90 tablet 3  . glucose blood (ONETOUCH VERIO) test  strip Use to check blood sugar three times a day 300 each 1  . GLYXAMBI 25-5 MG TABS TAKE 1 TABLET BY MOUTH EVERY DAY IN THE MORNING 90 tablet 3  . hydrochlorothiazide (HYDRODIURIL) 25 MG tablet TAKE 1 TABLET (25 MG TOTAL) BY MOUTH DAILY. 90 tablet 3  . Influenza Vac Subunit Quad (FLUCELVAX QUADRIVALENT) 0.5 ML SUSY Flucelvax Quad 2018-2019 (PF) 60 mcg (15 mcg x 4)/0.5 mL IM syringe  TO BE ADMINISTERED BY PHARMACIST FOR IMMUNIZATION    . loratadine (CLARITIN) 10 MG tablet Take 1 tablet (10 mg total) by mouth daily. (Patient not taking: Reported on 06/23/2015) 100 tablet 3  . olmesartan (BENICAR) 40 MG tablet Take 1 tablet (40 mg total) by mouth daily. 90 tablet 3  . ONETOUCH DELICA LANCETS FINE MISC 1 Device by Does not apply route daily as needed. 50 each 3  . pantoprazole (PROTONIX) 40 MG tablet     . telmisartan (MICARDIS) 80 MG tablet Take 0.5 tablets (40 mg total) by mouth daily. Overdue for yearly physical w/labs must see provider for refills 15 tablet 0  . terbinafine (LAMISIL) 250 MG tablet Take 1 tablet (250 mg total) by mouth daily. 90 tablet 0  . terbinafine (LAMISIL) 250 MG tablet Take 1 tablet (250 mg total) by mouth daily. 90 tablet 0   No current facility-administered medications on file prior to visit.    Allergies  Allergen Reactions  . Cetirizine Hcl     REACTION: fatigue  . Metformin And Related Nausea And Vomiting  . Penicillins Nausea And Vomiting  . Valsartan     REACTION: rash   Family History  Problem Relation Age of Onset  . Diabetes Mother   . Stroke Mother   . Cancer Father        colon or liver  Thyroid ds. And HTN in Mother.  PE: BP 102/60   Pulse 88   Ht 5\' 4"  (1.626 m)   Wt 173 lb (78.5 kg)   SpO2 98%   BMI 29.70 kg/m  Body mass index is 29.7 kg/m.  Wt Readings from Last 3 Encounters:  05/05/18 173 lb (78.5 kg)  04/27/18 172 lb 1.3 oz (78.1 kg)  01/26/18 172 lb (78 kg)   Constitutional: overweight, in NAD Eyes: PERRLA, EOMI, no  exophthalmos ENT: moist mucous membranes, no thyromegaly, no cervical lymphadenopathy Cardiovascular: RRR, No MRG Respiratory: CTA B Gastrointestinal: abdomen soft, NT, ND, BS+ Musculoskeletal: no deformities, strength intact in all 4 Skin: moist, warm, no rashes Neurological: no tremor with outstretched hands, DTR normal in all 4  ASSESSMENT: 1. DM2, non-insulin-dependent, uncontrolled, without long term complications  2. HL  3. Overweight  PLAN:  1. Patient with history of controlled diabetes, on oral antidiabetic regimen we did review for inhibitor, SGLT 2 inhibitor, and glipizide XL.  No side effects from her medications.  At last visit, she was not taking the medications correctly, taking Glyxambi at night.  I advised her to move this before breakfast.  We left glipizide before lunch. -She continues to go to the gym and exercise consistently.  We discussed about improving diet at last visit.  She was not checking sugars consistently and I advised her to check once a day, rotating check times. -In this visit, sugars remain controlled.  However, since last visit, she moved the glipizide and Glyxambi both in the evening, without the relationship to dinner.  She did that because when she was taking them in the morning she developed dizziness and did not feel well.  Unclear if she had low blood sugars then.  At this visit, I advised her to move Glyxambi to the morning, but we will stop glipizide. - I suggested to:  Patient Instructions  Please move: - Glyxambi 25-5 mg daily before b'fast.  Please stop: - Glipizide XL  Please let me know if the sugars are consistently <80 or >200.  Please return in 6 months with your sugar log  - today, HbA1c is 6.4% (stable, at goal) - continue checking sugars at different times of the day - check 1x a day, rotating checks - advised for yearly eye exams >> she is UTD - Return to clinic in 6 mo with sugar log    2. HL - Reviewed latest lipid  panel from 1 year ago: LDL improved, but still above goal Lab Results  Component Value Date   CHOL 179 05/11/2017   HDL 49.30 05/11/2017   LDLCALC 109 (H) 05/11/2017   LDLDIRECT 122.0 04/01/2017   TRIG 104.0 05/11/2017   CHOLHDL 4 05/11/2017  -She is not on a statin and does not remember taking one.  I did advise her to start a statin after her cholesterol level returned high in 03/2017. -We will need another lipid panel but she would not want to have this done now since she did not go to the gym recently.  She was also like to improve her diet before having this rechecked.  3. Overweight - no signif. Wt loss since last OV - continue SGLT2 inhibitor which should also help with weight loss.  Will also stop glipizide which should help with weight loss   Carlus Pavlov, MD PhD [facility]

## 2018-05-05 NOTE — Addendum Note (Signed)
Addended by: Darliss RidgelONGER, Jlyn Bracamonte I on: 05/05/2018 11:32 AM   Modules accepted: Orders

## 2018-05-05 NOTE — Patient Instructions (Addendum)
Please move: - Glyxambi 25-5 mg daily before b'fast.  Please stop: - Glipizide XL  Please let me know if the sugars are consistently <80 or >200.  Please return in 5 months with your sugar log

## 2018-06-28 ENCOUNTER — Telehealth: Payer: Self-pay | Admitting: Internal Medicine

## 2018-06-28 NOTE — Telephone Encounter (Signed)
Dr. Elvera Lennox please advise, I am unsure of this one.

## 2018-06-28 NOTE — Telephone Encounter (Signed)
Patient states she needs coupon/discount card for her Glyxambi 25-5 MG Tablets so that she is able to afford the coast of the medication.    Please contact her at [phone]

## 2018-06-28 NOTE — Telephone Encounter (Signed)
Yes, I think we have these - will look in the am.

## 2018-07-12 ENCOUNTER — Telehealth: Payer: Self-pay

## 2018-07-12 NOTE — Telephone Encounter (Signed)
Patient can not afford her medication and we do not have discount cards.  Please advise.  Glyxambi 25-5 mg

## 2018-07-12 NOTE — Telephone Encounter (Signed)
We do have more >> on your desk.

## 2018-07-12 NOTE — Telephone Encounter (Signed)
Card placed at front, patient notified.

## 2018-07-26 ENCOUNTER — Other Ambulatory Visit (INDEPENDENT_AMBULATORY_CARE_PROVIDER_SITE_OTHER): Payer: Managed Care, Other (non HMO)

## 2018-07-26 ENCOUNTER — Ambulatory Visit (INDEPENDENT_AMBULATORY_CARE_PROVIDER_SITE_OTHER): Payer: Managed Care, Other (non HMO) | Admitting: Internal Medicine

## 2018-07-26 ENCOUNTER — Encounter: Payer: Self-pay | Admitting: Internal Medicine

## 2018-07-26 VITALS — BP 114/68 | HR 90 | Temp 98.0°F | Ht 64.0 in | Wt 171.0 lb

## 2018-07-26 DIAGNOSIS — R7989 Other specified abnormal findings of blood chemistry: Secondary | ICD-10-CM

## 2018-07-26 DIAGNOSIS — Z0000 Encounter for general adult medical examination without abnormal findings: Secondary | ICD-10-CM | POA: Diagnosis not present

## 2018-07-26 DIAGNOSIS — E119 Type 2 diabetes mellitus without complications: Secondary | ICD-10-CM

## 2018-07-26 DIAGNOSIS — I10 Essential (primary) hypertension: Secondary | ICD-10-CM

## 2018-07-26 LAB — URINALYSIS
Bilirubin Urine: NEGATIVE
HGB URINE DIPSTICK: NEGATIVE
Ketones, ur: NEGATIVE
LEUKOCYTE UA: NEGATIVE
NITRITE: NEGATIVE
Specific Gravity, Urine: 1.01 (ref 1.000–1.030)
Total Protein, Urine: NEGATIVE
Urobilinogen, UA: 0.2 (ref 0.0–1.0)
pH: 6.5 (ref 5.0–8.0)

## 2018-07-26 NOTE — Assessment & Plan Note (Signed)
A cardiac CT scan for coronary calcium offered 

## 2018-07-26 NOTE — Patient Instructions (Signed)

## 2018-07-26 NOTE — Progress Notes (Signed)
Subjective:  Patient ID: Elizabeth Ware, female    DOB: 05/04/1961  Age: 58 y.o. MRN: [id number]  CC: No chief complaint on file.   HPI Elizabeth Ware presents for HTN, DM, allergies f/u C/o thoracic back pain - s/p recent "cold"  Outpatient Medications Prior to Visit  Medication Sig Dispense Refill  . clorazepate (TRANXENE) 7.5 MG tablet TAKE 1 TABLET BY MOUTH TWICE A DAY AS NEEDED FOR ANXIETY/SLEEP 60 tablet 3  . fluticasone (FLONASE) 50 MCG/ACT nasal spray Place into both nostrils daily.    Marland Kitchen glucose blood (ONETOUCH VERIO) test strip Use to check blood sugar three times a day 300 each 1  . GLYXAMBI 25-5 MG TABS TAKE 1 TABLET BY MOUTH EVERY DAY IN THE MORNING 90 tablet 3  . hydrochlorothiazide (HYDRODIURIL) 25 MG tablet TAKE 1 TABLET (25 MG TOTAL) BY MOUTH DAILY. 90 tablet 3  . Influenza Vac Subunit Quad (FLUCELVAX QUADRIVALENT) 0.5 ML SUSY Flucelvax Quad 2018-2019 (PF) 60 mcg (15 mcg x 4)/0.5 mL IM syringe  TO BE ADMINISTERED BY PHARMACIST FOR IMMUNIZATION    . olmesartan (BENICAR) 40 MG tablet Take 1 tablet (40 mg total) by mouth daily. 90 tablet 3  . ONETOUCH DELICA LANCETS FINE MISC 1 Device by Does not apply route daily as needed. 50 each 3  . pantoprazole (PROTONIX) 40 MG tablet     . terbinafine (LAMISIL) 250 MG tablet Take 1 tablet (250 mg total) by mouth daily. 90 tablet 0  . terbinafine (LAMISIL) 250 MG tablet Take 1 tablet (250 mg total) by mouth daily. 90 tablet 0  . loratadine (CLARITIN) 10 MG tablet Take 1 tablet (10 mg total) by mouth daily. (Patient not taking: Reported on 06/23/2015) 100 tablet 3  . doxycycline (VIBRA-TABS) 100 MG tablet Take 1 tablet (100 mg total) by mouth 2 (two) times daily. 20 tablet 0   No facility-administered medications prior to visit.     ROS: Review of Systems  Constitutional: Negative for activity change, appetite change, chills, fatigue and unexpected weight change.  HENT: Negative for congestion, mouth sores and sinus pressure.     Eyes: Negative for visual disturbance.  Respiratory: Negative for cough and chest tightness.   Gastrointestinal: Negative for abdominal pain and nausea.  Genitourinary: Negative for difficulty urinating, frequency and vaginal pain.  Musculoskeletal: Positive for back pain. Negative for gait problem.  Skin: Negative for pallor and rash.  Neurological: Negative for dizziness, tremors, weakness, numbness and headaches.  Psychiatric/Behavioral: Negative for confusion, sleep disturbance and suicidal ideas.    Objective:  BP 114/68 (BP Location: Left Arm, Patient Position: Sitting, Cuff Size: Normal)   Pulse 90   Temp 98 F (36.7 C) (Oral)   Ht 5\' 4"  (1.626 m)   Wt 171 lb (77.6 kg)   SpO2 94%   BMI 29.35 kg/m   BP Readings from Last 3 Encounters:  07/26/18 114/68  05/05/18 102/60  04/27/18 110/78    Wt Readings from Last 3 Encounters:  07/26/18 171 lb (77.6 kg)  05/05/18 173 lb (78.5 kg)  04/27/18 172 lb 1.3 oz (78.1 kg)    Physical Exam Constitutional:      General: She is not in acute distress.    Appearance: She is well-developed.  HENT:     Head: Normocephalic.     Right Ear: External ear normal.     Left Ear: External ear normal.     Nose: Nose normal.  Eyes:     General:  Right eye: No discharge.        Left eye: No discharge.     Conjunctiva/sclera: Conjunctivae normal.     Pupils: Pupils are equal, round, and reactive to light.  Neck:     Musculoskeletal: Normal range of motion and neck supple.     Thyroid: No thyromegaly.     Vascular: No JVD.     Trachea: No tracheal deviation.  Cardiovascular:     Rate and Rhythm: Normal rate and regular rhythm.     Heart sounds: Normal heart sounds.  Pulmonary:     Effort: No respiratory distress.     Breath sounds: No stridor. No wheezing.  Abdominal:     General: Bowel sounds are normal. There is no distension.     Palpations: Abdomen is soft. There is no mass.     Tenderness: There is no abdominal  tenderness. There is no guarding or rebound.  Musculoskeletal:        General: No tenderness.  Lymphadenopathy:     Cervical: No cervical adenopathy.  Skin:    Findings: No erythema or rash.  Neurological:     Cranial Nerves: No cranial nerve deficit.     Motor: No abnormal muscle tone.     Coordination: Coordination normal.     Deep Tendon Reflexes: Reflexes normal.  Psychiatric:        Behavior: Behavior normal.        Thought Content: Thought content normal.        Judgment: Judgment normal.     Lab Results  Component Value Date   WBC 7.3 09/19/2015   HGB 12.8 09/19/2015   HCT 37.2 09/19/2015   PLT 324.0 09/19/2015   GLUCOSE 157 (H) 04/01/2017   CHOL 179 05/11/2017   TRIG 104.0 05/11/2017   HDL 49.30 05/11/2017   LDLDIRECT 122.0 04/01/2017   LDLCALC 109 (H) 05/11/2017   ALT 25 04/01/2017   AST 23 04/01/2017   NA 138 04/01/2017   K 3.5 05/11/2017   CL 99 04/01/2017   CREATININE 0.95 04/01/2017   BUN 16 04/01/2017   CO2 29 04/01/2017   TSH 1.42 09/19/2015   INR 0.9 04/11/2007   HGBA1C 6.4 (A) 05/05/2018   MICROALBUR <0.7 04/01/2017    Mm 3d Screen Breast Bilateral  Result Date: 02/01/2018 CLINICAL DATA:  Screening. EXAM: DIGITAL SCREENING BILATERAL MAMMOGRAM WITH TOMO AND CAD COMPARISON:  Previous exam(s). ACR Breast Density Category b: There are scattered areas of fibroglandular density. FINDINGS: There are no findings suspicious for malignancy. Images were processed with CAD. IMPRESSION: No mammographic evidence of malignancy. A result letter of this screening mammogram will be mailed directly to the patient. RECOMMENDATION: Screening mammogram in one year. (Code:SM-B-01Y) BI-RADS CATEGORY  1: Negative. Electronically Signed   By: Beckie Salts M.D.   On: 02/01/2018 09:05    Assessment & Plan:   There are no diagnoses linked to this encounter.   No orders of the defined types were placed in this encounter.    Follow-up: No follow-ups on file.  Sonda Primes, MD

## 2018-07-26 NOTE — Assessment & Plan Note (Addendum)
A cardiac CT scan for coronary calcium offered  Metformin

## 2018-07-26 NOTE — Assessment & Plan Note (Signed)
A cardiac CT scan for coronary calcium offered   BP Readings from Last 3 Encounters:  07/26/18 114/68  05/05/18 102/60  04/27/18 110/78

## 2018-07-27 ENCOUNTER — Ambulatory Visit: Payer: Managed Care, Other (non HMO) | Admitting: Internal Medicine

## 2018-10-06 ENCOUNTER — Other Ambulatory Visit: Payer: Self-pay

## 2018-10-06 ENCOUNTER — Ambulatory Visit (INDEPENDENT_AMBULATORY_CARE_PROVIDER_SITE_OTHER): Payer: Managed Care, Other (non HMO) | Admitting: Internal Medicine

## 2018-10-06 ENCOUNTER — Encounter: Payer: Self-pay | Admitting: Internal Medicine

## 2018-10-06 DIAGNOSIS — E119 Type 2 diabetes mellitus without complications: Secondary | ICD-10-CM | POA: Diagnosis not present

## 2018-10-06 DIAGNOSIS — E663 Overweight: Secondary | ICD-10-CM | POA: Diagnosis not present

## 2018-10-06 DIAGNOSIS — E785 Hyperlipidemia, unspecified: Secondary | ICD-10-CM | POA: Diagnosis not present

## 2018-10-06 MED ORDER — EMPAGLIFLOZIN-LINAGLIPTIN 25-5 MG PO TABS: 1.0000 | ORAL_TABLET | Freq: Every day | ORAL | 3 refills | Status: DC

## 2018-10-06 NOTE — Patient Instructions (Signed)
  Please continue: - Glyxambi  25-5 mg daily before b'fast.  Please return in 4 months with your sugar log

## 2018-10-06 NOTE — Progress Notes (Signed)
Patient ID: Elizabeth Ware, female   DOB: [birth year], 58 y.o.   MRN: [id number]  Patient location: Home My location: Office  Referring Provider: Tresa Garter, MD  I connected with the patient on 10/06/18 at  9:03 AM EDT by a video enabled telemedicine application and verified that I am speaking with the correct person.   I discussed the limitations of evaluation and management by telemedicine and the availability of in person appointments. The patient expressed understanding and agreed to proceed.   Details of the encounter are shown below.  HPI: Elizabeth Ware is a 58 y.o.-year-old female, presenting for f/u for DM2, prev GDM, dx in 05/2014, non-insulin-dependent, uncontrolled, without complications. Last visit 6 months ago.  Last hemoglobin A1c was: Lab Results  Component Value Date   HGBA1C 6.4 (A) 05/05/2018   HGBA1C 6.4 (A) 10/28/2017   HGBA1C 6.0 04/01/2017   Pt is on a regimen of: - Glyxambi 25-5 mg daily before b'fast.  She was previously taking it at bedtime or before dinner. At last visit, we stopped glipizide XL 5 mg in a.m. >> she is feeling much better. Tried Metformin before >> nausea.  She is checking CBGs once a day: - am: 101-120 >> 100-105 >> 100-110 >> 98-110 - 2h after b'fast: n/c  - before lunch: n/c - 2h after lunch: 172-200 >> 160 >> n/c >> 160 - before dinner: n/c - 2h after dinner: n/c - bedtime: n/c >> 90-105 >> 108-125 >> 100-120s - nighttime: n/c Lowest sugar was 90s >> 98; it is unclear at which level she has hypoglycemia awareness. Highest sugar was 125 >> 160.  Glucometer: One Touch Verio  Pt's meals are: - Breakfast: 2 Eggos + fruit - Lunch: pita tuna; [employer]; yoghurt - Dinner: meat + veggies + spaghetti - Snacks: dill pickles, nuts, chocolate, potato chips, grapes She saw nutrition in the past: Helped  She was exercising at the gym: Body pump, Zumba.  Not recently 2/2 coronavirus pandemic. She is walking.  -No CKD, last  BUN/creatinine:  Lab Results  Component Value Date   BUN 16 04/01/2017   CREATININE 0.95 04/01/2017  On olmesartan.  -+ HL; latest set of lipids: Lab Results  Component Value Date   CHOL 179 05/11/2017   HDL 49.30 05/11/2017   LDLCALC 109 (H) 05/11/2017   LDLDIRECT 122.0 04/01/2017   TRIG 104.0 05/11/2017   CHOLHDL 4 05/11/2017  I suggested a statin but she did not start it.  - last eye exam was in 12/2017: No DR. Dr. Lawerance Bach in [facility].  - no numbness and tingling in her feet.  She has a h/o diverticulitis >> part of colon removed in 2008.  She is a caregiver for her mother, who is now in a SNF.  ROS: Constitutional: + weight gain/no weight loss, no fatigue, no subjective hyperthermia, no subjective hypothermia Eyes: no blurry vision, no xerophthalmia ENT: no sore throat, no nodules palpated in neck, no dysphagia, no odynophagia, no hoarseness Cardiovascular: no CP/no SOB/no palpitations/no leg swelling Respiratory: no cough/no SOB/no wheezing Gastrointestinal: no N/no V/no D/no C/no acid reflux Musculoskeletal: no muscle aches/no joint aches Skin: no rashes, no hair loss Neurological: no tremors/no numbness/no tingling/no dizziness  I reviewed pt's medications, allergies, PMH, social hx, family hx, and changes were documented in the history of present illness. Otherwise, unchanged from my initial visit note.  Past Medical History:  Diagnosis Date  . Diabetes mellitus without complication (HCC)   . Diverticulitis   .  Hypertension    Past Surgical History:  Procedure Laterality Date  . COLON SURGERY     History   Social History  . Marital Status: Married    Spouse Name: N/A  . Number of Children: 1   Occupational History  .    Social History Main Topics  . Smoking status: Never Smoker   . Smokeless tobacco: Not on file  . Alcohol Use: No  . Drug Use: No   Current Outpatient Medications on File Prior to Visit  Medication Sig Dispense Refill  .  clorazepate (TRANXENE) 7.5 MG tablet TAKE 1 TABLET BY MOUTH TWICE A DAY AS NEEDED FOR ANXIETY/SLEEP 60 tablet 3  . fluticasone (FLONASE) 50 MCG/ACT nasal spray Place into both nostrils daily.    Marland Kitchen glucose blood (ONETOUCH VERIO) test strip Use to check blood sugar three times a day 300 each 1  . GLYXAMBI 25-5 MG TABS TAKE 1 TABLET BY MOUTH EVERY DAY IN THE MORNING 90 tablet 3  . hydrochlorothiazide (HYDRODIURIL) 25 MG tablet TAKE 1 TABLET (25 MG TOTAL) BY MOUTH DAILY. 90 tablet 3  . Influenza Vac Subunit Quad (FLUCELVAX QUADRIVALENT) 0.5 ML SUSY Flucelvax Quad 2018-2019 (PF) 60 mcg (15 mcg x 4)/0.5 mL IM syringe  TO BE ADMINISTERED BY PHARMACIST FOR IMMUNIZATION    . loratadine (CLARITIN) 10 MG tablet Take 1 tablet (10 mg total) by mouth daily. (Patient not taking: Reported on 06/23/2015) 100 tablet 3  . olmesartan (BENICAR) 40 MG tablet Take 1 tablet (40 mg total) by mouth daily. 90 tablet 3  . ONETOUCH DELICA LANCETS FINE MISC 1 Device by Does not apply route daily as needed. 50 each 3  . pantoprazole (PROTONIX) 40 MG tablet     . terbinafine (LAMISIL) 250 MG tablet Take 1 tablet (250 mg total) by mouth daily. 90 tablet 0  . terbinafine (LAMISIL) 250 MG tablet Take 1 tablet (250 mg total) by mouth daily. 90 tablet 0   No current facility-administered medications on file prior to visit.    Allergies  Allergen Reactions  . Cetirizine Hcl     REACTION: fatigue  . Metformin And Related Nausea And Vomiting  . Penicillins Nausea And Vomiting  . Valsartan     REACTION: rash   Family History  Problem Relation Age of Onset  . Diabetes Mother   . Stroke Mother   . Cancer Father        colon or liver  Thyroid ds. And HTN in Mother.  PE: There were no vitals taken for this visit. There is no height or weight on file to calculate BMI.  Wt Readings from Last 3 Encounters:  07/26/18 171 lb (77.6 kg)  05/05/18 173 lb (78.5 kg)  04/27/18 172 lb 1.3 oz (78.1 kg)   Constitutional:  in  NAD  The physical exam was not performed (virtual visit).  ASSESSMENT: 1. DM2, non-insulin-dependent, uncontrolled, without long term complications  2. HL  3. Overweight  PLAN:  1. Patient with history of controlled diabetes, on oral antidiabetic regimen, with DPP 4 inhibitor + SGLT 2 inhibitor.  At last visit, she was also glipizide XL, which we were able to stop due to improved blood sugars after starting to going to the gym and exercising consistently and we also discussed about improving her diet.   However at that time, she was taking Glyxambi, in the evening without the relationship to dinner and we discussed about moving this in the morning.  She did try to take  it in the morning before she developed dizziness but it was unclear whether this was from low blood sugars as she did not check the sugars then. -At this visit, sugars are still at goal and she tells me that she feels much better after she stopped glipizide.  She does not have dizziness and feels like she can think more clearly.  Also, she tolerates taking Glyxambi in the morning much better than before.  We will continue this regimen.  I refilled her medication. - I suggested to:  Patient Instructions  Please continue: - Glyxambi  25-5 mg daily before b'fast.  Please return in 4 months with your sugar log  -We will check her HbA1c when she returns to the clinic - continue checking sugars at different times of the day - check 1x a day, rotating checks - advised for yearly eye exams >> she is UTD - Return to clinic in 4 mo with sugar log    2. HL - Reviewed latest lipid panel from 2018: LDL improved, but still above goal.  Lab Results  Component Value Date   CHOL 179 05/11/2017   HDL 49.30 05/11/2017   LDLCALC 109 (H) 05/11/2017   LDLDIRECT 122.0 04/01/2017   TRIG 104.0 05/11/2017   CHOLHDL 4 05/11/2017  -I did advise him to start a statin after the above labs return but she wanted to repeat her lipid panel before  starting one.  She was planning to go to the gym and improve her diet before rechecking her lipids again.  3. Overweight -No significant weight change since last visit (maybe gained few lbs as she was not exercising in the last 2 to 3 months due to the coronavirus pandemic)  -Continue the SGLT 2 inhibitor which should also help with weight loss. -At last visit, we were able to stop glipizide, which is conducive to weight gain  Carlus Pavlovristina Kamisha Ell, MD PhD [facility]

## 2018-10-09 ENCOUNTER — Telehealth: Payer: Self-pay | Admitting: Internal Medicine

## 2018-10-09 NOTE — Telephone Encounter (Signed)
If no covid symptoms okay to be seen in office

## 2018-10-09 NOTE — Telephone Encounter (Signed)
Patient would like to know if she needs to schedule an in office or a virtual visit to discuss pressure in ears.  States ears bleed one time. States has been going on for a month.   Please advise.

## 2018-10-10 NOTE — Telephone Encounter (Signed)
Patient scheduled.

## 2018-10-11 ENCOUNTER — Encounter: Payer: Self-pay | Admitting: Internal Medicine

## 2018-10-11 ENCOUNTER — Other Ambulatory Visit: Payer: Self-pay

## 2018-10-11 ENCOUNTER — Ambulatory Visit (INDEPENDENT_AMBULATORY_CARE_PROVIDER_SITE_OTHER): Payer: Managed Care, Other (non HMO) | Admitting: Internal Medicine

## 2018-10-11 DIAGNOSIS — I10 Essential (primary) hypertension: Secondary | ICD-10-CM | POA: Diagnosis not present

## 2018-10-11 DIAGNOSIS — H6590 Unspecified nonsuppurative otitis media, unspecified ear: Secondary | ICD-10-CM | POA: Insufficient documentation

## 2018-10-11 DIAGNOSIS — H6523 Chronic serous otitis media, bilateral: Secondary | ICD-10-CM | POA: Diagnosis not present

## 2018-10-11 DIAGNOSIS — E119 Type 2 diabetes mellitus without complications: Secondary | ICD-10-CM

## 2018-10-11 MED ORDER — DOXYCYCLINE HYCLATE 100 MG PO TABS: 100.0000 mg | ORAL_TABLET | Freq: Two times a day (BID) | ORAL | 1 refills | Status: DC

## 2018-10-11 MED ORDER — OXYMETAZOLINE HCL 0.05 % NA SOLN: 1.0000 | Freq: Two times a day (BID) | NASAL | 0 refills | Status: DC

## 2018-10-11 MED ORDER — FLUTICASONE PROPIONATE 50 MCG/ACT NA SUSP: 2.0000 | Freq: Every day | NASAL | 1 refills | Status: AC

## 2018-10-11 MED ORDER — HYDROCHLOROTHIAZIDE 25 MG PO TABS: 25.0000 mg | ORAL_TABLET | Freq: Every day | ORAL | 3 refills | Status: DC

## 2018-10-11 NOTE — Patient Instructions (Signed)

## 2018-10-11 NOTE — Assessment & Plan Note (Signed)
HCTZ renewed  BP Readings from Last 3 Encounters:  10/11/18 102/80  07/26/18 114/68  05/05/18 102/60

## 2018-10-11 NOTE — Progress Notes (Signed)
Subjective:  Patient ID: Elizabeth Ware, female    DOB: 02/28/1961  Age: 58 y.o. MRN: [id number]  CC: Ear Pain (Bilateral, x 1.5 months using Claritin and Dimetapp)   HPI Elizabeth Ware presents for B ear congestion - OTC meds - no help C/o pain in the ears F/u HTN    Outpatient Medications Prior to Visit  Medication Sig Dispense Refill  . clorazepate (TRANXENE) 7.5 MG tablet TAKE 1 TABLET BY MOUTH TWICE A DAY AS NEEDED FOR ANXIETY/SLEEP 60 tablet 3  . Empagliflozin-linaGLIPtin (GLYXAMBI) 25-5 MG TABS Take 1 tablet by mouth daily. 90 tablet 3  . fluticasone (FLONASE) 50 MCG/ACT nasal spray Place into both nostrils daily.    Marland Kitchen. glucose blood (ONETOUCH VERIO) test strip Use to check blood sugar three times a day 300 each 1  . hydrochlorothiazide (HYDRODIURIL) 25 MG tablet TAKE 1 TABLET (25 MG TOTAL) BY MOUTH DAILY. 90 tablet 3  . olmesartan (BENICAR) 40 MG tablet Take 1 tablet (40 mg total) by mouth daily. 90 tablet 3  . ONETOUCH DELICA LANCETS FINE MISC 1 Device by Does not apply route daily as needed. 50 each 3  . pantoprazole (PROTONIX) 40 MG tablet     . terbinafine (LAMISIL) 250 MG tablet Take 1 tablet (250 mg total) by mouth daily. 90 tablet 0   No facility-administered medications prior to visit.     ROS: Review of Systems  Constitutional: Negative for activity change, appetite change, chills, fatigue and unexpected weight change.  HENT: Positive for congestion, ear discharge, ear pain and sinus pressure. Negative for hearing loss, mouth sores, nosebleeds, postnasal drip and sinus pain.   Eyes: Negative for visual disturbance.  Respiratory: Negative for cough and chest tightness.   Gastrointestinal: Negative for abdominal pain and nausea.  Genitourinary: Negative for difficulty urinating, frequency and vaginal pain.  Musculoskeletal: Negative for back pain and gait problem.  Skin: Negative for pallor and rash.  Neurological: Negative for dizziness, tremors, weakness,  numbness and headaches.  Psychiatric/Behavioral: Negative for confusion and sleep disturbance.    Objective:  Ht 5\' 4"  (1.626 m)   Wt 173 lb (78.5 kg)   BMI 29.70 kg/m   BP Readings from Last 3 Encounters:  07/26/18 114/68  05/05/18 102/60  04/27/18 110/78    Wt Readings from Last 3 Encounters:  10/11/18 173 lb (78.5 kg)  07/26/18 171 lb (77.6 kg)  05/05/18 173 lb (78.5 kg)    Physical Exam Constitutional:      General: She is not in acute distress.    Appearance: She is well-developed.  HENT:     Head: Normocephalic and atraumatic.     Right Ear: External ear normal. There is no impacted cerumen.     Left Ear: External ear normal. There is no impacted cerumen.     Nose: Nose normal.     Mouth/Throat:     Mouth: Mucous membranes are moist.  Eyes:     General:        Right eye: No discharge.        Left eye: No discharge.     Conjunctiva/sclera: Conjunctivae normal.     Pupils: Pupils are equal, round, and reactive to light.  Neck:     Musculoskeletal: Normal range of motion and neck supple.     Thyroid: No thyromegaly.     Vascular: No JVD.     Trachea: No tracheal deviation.  Cardiovascular:     Rate and Rhythm: Normal rate and regular  rhythm.     Heart sounds: Normal heart sounds.  Pulmonary:     Effort: No respiratory distress.     Breath sounds: No stridor. No wheezing.  Abdominal:     General: Bowel sounds are normal. There is no distension.     Palpations: Abdomen is soft. There is no mass.     Tenderness: There is no abdominal tenderness. There is no guarding or rebound.  Musculoskeletal:        General: No tenderness.  Lymphadenopathy:     Cervical: No cervical adenopathy.  Skin:    Findings: No erythema or rash.  Neurological:     Cranial Nerves: No cranial nerve deficit.     Motor: No abnormal muscle tone.     Coordination: Coordination normal.     Deep Tendon Reflexes: Reflexes normal.  Psychiatric:        Behavior: Behavior normal.         Thought Content: Thought content normal.        Judgment: Judgment normal.    Fluid noted behind both tympanic membranes.  There is a 1 x 2 mm area of erythema to left ear canal 7:00 Lab Results  Component Value Date   WBC 7.3 09/19/2015   HGB 12.8 09/19/2015   HCT 37.2 09/19/2015   PLT 324.0 09/19/2015   GLUCOSE 157 (H) 04/01/2017   CHOL 179 05/11/2017   TRIG 104.0 05/11/2017   HDL 49.30 05/11/2017   LDLDIRECT 122.0 04/01/2017   LDLCALC 109 (H) 05/11/2017   ALT 25 04/01/2017   AST 23 04/01/2017   NA 138 04/01/2017   K 3.5 05/11/2017   CL 99 04/01/2017   CREATININE 0.95 04/01/2017   BUN 16 04/01/2017   CO2 29 04/01/2017   TSH 1.42 09/19/2015   INR 0.9 04/11/2007   HGBA1C 6.4 (A) 05/05/2018   MICROALBUR <0.7 04/01/2017    Mm 3d Screen Breast Bilateral  Result Date: 02/01/2018 CLINICAL DATA:  Screening. EXAM: DIGITAL SCREENING BILATERAL MAMMOGRAM WITH TOMO AND CAD COMPARISON:  Previous exam(s). ACR Breast Density Category b: There are scattered areas of fibroglandular density. FINDINGS: There are no findings suspicious for malignancy. Images were processed with CAD. IMPRESSION: No mammographic evidence of malignancy. A result letter of this screening mammogram will be mailed directly to the patient. RECOMMENDATION: Screening mammogram in one year. (Code:SM-B-01Y) BI-RADS CATEGORY  1: Negative. Electronically Signed   By: Beckie Salts M.D.   On: 02/01/2018 09:05    Assessment & Plan:   There are no diagnoses linked to this encounter.   No orders of the defined types were placed in this encounter.    Follow-up: No follow-ups on file.  Sonda Primes, MD

## 2018-10-11 NOTE — Assessment & Plan Note (Addendum)
Doxy for 10 days -due to  long duration of the problem, it is likely that we are dealing with a an infectious arthritis at present ENT if not better Afrin nasal spray for 5 days.  Flonase to continue

## 2018-10-11 NOTE — Assessment & Plan Note (Signed)
Will not use a depoMedrol injection for serous otitis due to diabetes

## 2018-12-01 ENCOUNTER — Ambulatory Visit (INDEPENDENT_AMBULATORY_CARE_PROVIDER_SITE_OTHER): Payer: Managed Care, Other (non HMO) | Admitting: Internal Medicine

## 2018-12-01 ENCOUNTER — Encounter: Payer: Self-pay | Admitting: Internal Medicine

## 2018-12-01 ENCOUNTER — Other Ambulatory Visit: Payer: Self-pay

## 2018-12-01 DIAGNOSIS — H6523 Chronic serous otitis media, bilateral: Secondary | ICD-10-CM | POA: Diagnosis not present

## 2018-12-01 DIAGNOSIS — E119 Type 2 diabetes mellitus without complications: Secondary | ICD-10-CM

## 2018-12-01 DIAGNOSIS — I10 Essential (primary) hypertension: Secondary | ICD-10-CM

## 2018-12-01 DIAGNOSIS — J310 Chronic rhinitis: Secondary | ICD-10-CM | POA: Diagnosis not present

## 2018-12-01 MED ORDER — DOXYCYCLINE HYCLATE 100 MG PO TABS: 100.0000 mg | ORAL_TABLET | Freq: Two times a day (BID) | ORAL | 1 refills | Status: DC

## 2018-12-01 NOTE — Patient Instructions (Signed)
Please take all new medication as prescribed - the antibiotic  You can also add OTC Allegra or Claritin, and You can also take Mucinex (or it's generic off brand) for congestion, and tylenol as needed for pain.  OK to restart the Afrin for 3 days only  Please continue all other medications as before, including the flonase  Please call if not improved in 3-5 days, for referral to ENT  Please have the pharmacy call with any other refills you may need.  Please keep your appointments with your specialists as you may have planned

## 2018-12-01 NOTE — Progress Notes (Signed)
Subjective:    Patient ID: Elizabeth Ware, female    DOB: [birth year], 58 y.o.   MRN: [id number]  HPI  Here with recurrent symptoms similar it seems to may 20 visit; pt c/o bilat ear pain and pressure, muffled hearing and fullness, this time left ear > right, with mild stable sinus congestion and d/c it seems; Denies HA, fever, ST, prod cough, and Pt denies chest pain, increased sob or doe, wheezing, orthopnea, PND, increased LE swelling, palpitations, dizziness or syncope.  Did better last time with antibiotic, flonase and afrin.  Pt denies new neurological symptoms such as new headache, or facial or extremity weakness or numbness   Pt denies polydipsia, polyuria.  Has ongoing mild allergy symptoms with congestion.  Past Medical History:  Diagnosis Date  . Diabetes mellitus without complication ([place])   . Diverticulitis   . Hypertension    Past Surgical History:  Procedure Laterality Date  . COLON SURGERY      reports that she has never smoked. She has never used smokeless tobacco. She reports that she does not drink alcohol or use drugs. family history includes Cancer in her father; Diabetes in her mother; Stroke in her mother. Allergies  Allergen Reactions  . Cetirizine Hcl     REACTION: fatigue  . Metformin And Related Nausea And Vomiting  . Penicillins Nausea And Vomiting  . Valsartan     REACTION: rash   Current Outpatient Medications on File Prior to Visit  Medication Sig Dispense Refill  . clorazepate (TRANXENE) 7.5 MG tablet TAKE 1 TABLET BY MOUTH TWICE A DAY AS NEEDED FOR ANXIETY/SLEEP 60 tablet 3  . Empagliflozin-linaGLIPtin (GLYXAMBI) 25-5 MG TABS Take 1 tablet by mouth daily. 90 tablet 3  . fluticasone (FLONASE) 50 MCG/ACT nasal spray Place 2 sprays into both nostrils daily. 16 g 1  . glucose blood (ONETOUCH VERIO) test strip Use to check blood sugar three times a day 300 each 1  . hydrochlorothiazide (HYDRODIURIL) 25 MG tablet Take 1 tablet (25 mg total) by mouth daily.  90 tablet 3  . olmesartan (BENICAR) 40 MG tablet Take 1 tablet (40 mg total) by mouth daily. 90 tablet 3  . ONETOUCH DELICA LANCETS FINE MISC 1 Device by Does not apply route daily as needed. 50 each 3  . oxymetazoline (AFRIN NASAL SPRAY) 0.05 % nasal spray Place 1 spray into both nostrils 2 (two) times daily. 30 mL 0  . pantoprazole (PROTONIX) 40 MG tablet     . terbinafine (LAMISIL) 250 MG tablet Take 1 tablet (250 mg total) by mouth daily. 90 tablet 0   No current facility-administered medications on file prior to visit.    Review of Systems  Constitutional: Negative for other unusual diaphoresis or sweats HENT: Negative for ear discharge or swelling Eyes: Negative for other worsening visual disturbances Respiratory: Negative for stridor or other swelling  Gastrointestinal: Negative for worsening distension or other blood Genitourinary: Negative for retention or other urinary change Musculoskeletal: Negative for other MSK pain or swelling Skin: Negative for color change or other new lesions Neurological: Negative for worsening tremors and other numbness  Psychiatric/Behavioral: Negative for worsening agitation or other fatigue All other system neg per pt    Objective:   Physical Exam BP 118/76   Pulse 100   Temp 98 F (36.7 C) (Oral)   Ht 5\' 4"  (1.626 m)   Wt 176 lb (79.8 kg)   SpO2 96%   BMI 30.21 kg/m  VS noted,  Constitutional:  Pt appears in NAD HENT: Head: NCAT.  Right tm's with mild erythema, Left TM with severe erythema and small effusion,.  Max sinus areas non tender.  Pharynx with mild erythema, no exudate Right Ear: External ear normal.  Left Ear: External ear normal.  Eyes: . Pupils are equal, round, and reactive to light. Conjunctivae and EOM are normal Nose: without d/c or deformity Neck: Neck supple. Gross normal ROM Cardiovascular: Normal rate and regular rhythm.   Pulmonary/Chest: Effort normal and breath sounds without rales or wheezing.  Abd:  Soft, NT,  ND, + BS, no organomegaly Neurological: Pt is alert. At baseline orientation, motor grossly intact Skin: Skin is warm. No rashes, other new lesions, no LE edema Psychiatric: Pt behavior is normal without agitation  No other exam findings Lab Results  Component Value Date   WBC 7.3 09/19/2015   HGB 12.8 09/19/2015   HCT 37.2 09/19/2015   PLT 324.0 09/19/2015   GLUCOSE 157 (H) 04/01/2017   CHOL 179 05/11/2017   TRIG 104.0 05/11/2017   HDL 49.30 05/11/2017   LDLDIRECT 122.0 04/01/2017   LDLCALC 109 (H) 05/11/2017   ALT 25 04/01/2017   AST 23 04/01/2017   NA 138 04/01/2017   K 3.5 05/11/2017   CL 99 04/01/2017   CREATININE 0.95 04/01/2017   BUN 16 04/01/2017   CO2 29 04/01/2017   TSH 1.42 09/19/2015   INR 0.9 04/11/2007   HGBA1C 6.4 (A) 05/05/2018   MICROALBUR <0.7 04/01/2017       Assessment & Plan:

## 2018-12-02 ENCOUNTER — Encounter: Payer: Self-pay | Admitting: Internal Medicine

## 2018-12-02 NOTE — Assessment & Plan Note (Signed)
stable overall by history and exam, recent data reviewed with pt, and pt to continue medical treatment as before,  to f/u any worsening symptoms or concerns,  BP Readings from Last 3 Encounters:  12/01/18 118/76  10/11/18 102/80  07/26/18 114/68

## 2018-12-02 NOTE — Assessment & Plan Note (Signed)
Also or add allegra or claritin otc,  to f/u any worsening symptoms or concerns

## 2018-12-02 NOTE — Assessment & Plan Note (Signed)
stable overall by history and exam, recent data reviewed with pt, and pt to continue medical treatment as before,  to f/u any worsening symptoms or concerns Lab Results  Component Value Date   HGBA1C 6.4 (A) 05/05/2018

## 2018-12-02 NOTE — Assessment & Plan Note (Signed)
Recurrent, for doxycycline course, flonase , and 3 days afrin topical, as well as mucinex otc prn,  to f/u any worsening symptoms or concerns

## 2018-12-14 ENCOUNTER — Encounter: Payer: Self-pay | Admitting: Internal Medicine

## 2018-12-14 ENCOUNTER — Other Ambulatory Visit: Payer: Self-pay

## 2018-12-14 ENCOUNTER — Ambulatory Visit (INDEPENDENT_AMBULATORY_CARE_PROVIDER_SITE_OTHER): Payer: Managed Care, Other (non HMO) | Admitting: Internal Medicine

## 2018-12-14 DIAGNOSIS — H6523 Chronic serous otitis media, bilateral: Secondary | ICD-10-CM | POA: Diagnosis not present

## 2018-12-14 MED ORDER — METHYLPREDNISOLONE ACETATE 80 MG/ML IJ SUSP
80.0000 mg | Freq: Once | INTRAMUSCULAR | Status: AC
  Administered 2018-12-14: 11:00:00 80 mg via INTRAMUSCULAR

## 2018-12-14 NOTE — Assessment & Plan Note (Addendum)
ENT ref Treat allergies Depo-medrol 80 mg - monitor CBGs

## 2018-12-14 NOTE — Progress Notes (Signed)
Subjective:  Patient ID: Elizabeth Ware, female    DOB: 07-Jan-1961  Age: 58 y.o. MRN: [id number]  CC: Otalgia (bilateral )   HPI Elizabeth Ware presents for B ear problem since May. Not better w/abx x2 cycles  Outpatient Medications Prior to Visit  Medication Sig Dispense Refill  . clorazepate (TRANXENE) 7.5 MG tablet TAKE 1 TABLET BY MOUTH TWICE A DAY AS NEEDED FOR ANXIETY/SLEEP 60 tablet 3  . doxycycline (VIBRA-TABS) 100 MG tablet Take 1 tablet (100 mg total) by mouth 2 (two) times daily. 20 tablet 1  . Empagliflozin-linaGLIPtin (GLYXAMBI) 25-5 MG TABS Take 1 tablet by mouth daily. 90 tablet 3  . fluticasone (FLONASE) 50 MCG/ACT nasal spray Place 2 sprays into both nostrils daily. 16 g 1  . glucose blood (ONETOUCH VERIO) test strip Use to check blood sugar three times a day 300 each 1  . hydrochlorothiazide (HYDRODIURIL) 25 MG tablet Take 1 tablet (25 mg total) by mouth daily. 90 tablet 3  . olmesartan (BENICAR) 40 MG tablet Take 1 tablet (40 mg total) by mouth daily. 90 tablet 3  . ONETOUCH DELICA LANCETS FINE MISC 1 Device by Does not apply route daily as needed. 50 each 3  . oxymetazoline (AFRIN NASAL SPRAY) 0.05 % nasal spray Place 1 spray into both nostrils 2 (two) times daily. 30 mL 0  . pantoprazole (PROTONIX) 40 MG tablet     . terbinafine (LAMISIL) 250 MG tablet Take 1 tablet (250 mg total) by mouth daily. 90 tablet 0   No facility-administered medications prior to visit.     ROS: Review of Systems  Constitutional: Negative for activity change, appetite change, chills, fatigue and unexpected weight change.  HENT: Positive for congestion, ear pain, hearing loss and sinus pressure. Negative for mouth sores.   Eyes: Negative for visual disturbance.  Respiratory: Negative for cough and chest tightness.   Gastrointestinal: Negative for abdominal pain and nausea.  Genitourinary: Negative for difficulty urinating, frequency and vaginal pain.  Musculoskeletal: Negative for back  pain and gait problem.  Skin: Negative for pallor and rash.  Neurological: Negative for dizziness, tremors, weakness, numbness and headaches.  Psychiatric/Behavioral: Negative for confusion and sleep disturbance.    Objective:  BP 130/88 (BP Location: Left Arm, Patient Position: Sitting, Cuff Size: Large)   Pulse 80   Temp 97.8 F (36.6 C) (Oral)   Ht 5\' 4"  (1.626 m)   Wt 176 lb (79.8 kg)   SpO2 98%   BMI 30.21 kg/m   BP Readings from Last 3 Encounters:  12/14/18 130/88  12/01/18 118/76  10/11/18 102/80    Wt Readings from Last 3 Encounters:  12/14/18 176 lb (79.8 kg)  12/01/18 176 lb (79.8 kg)  10/11/18 173 lb (78.5 kg)    Physical Exam Constitutional:      General: She is not in acute distress.    Appearance: She is well-developed.  HENT:     Head: Normocephalic.     Right Ear: Ear canal and external ear normal. There is no impacted cerumen.     Left Ear: Ear canal and external ear normal. There is no impacted cerumen.     Nose: Nose normal. No rhinorrhea.  Eyes:     General:        Right eye: No discharge.        Left eye: No discharge.     Conjunctiva/sclera: Conjunctivae normal.     Pupils: Pupils are equal, round, and reactive to light.  Neck:  Musculoskeletal: Normal range of motion and neck supple.     Thyroid: No thyromegaly.     Vascular: No JVD.     Trachea: No tracheal deviation.  Cardiovascular:     Rate and Rhythm: Normal rate and regular rhythm.     Heart sounds: Normal heart sounds.  Pulmonary:     Effort: No respiratory distress.     Breath sounds: No stridor. No wheezing.  Abdominal:     General: Bowel sounds are normal. There is no distension.     Palpations: Abdomen is soft. There is no mass.     Tenderness: There is no abdominal tenderness. There is no guarding or rebound.  Musculoskeletal:        General: No tenderness.  Lymphadenopathy:     Cervical: No cervical adenopathy.  Skin:    Findings: No erythema or rash.   Neurological:     Cranial Nerves: No cranial nerve deficit.     Motor: No abnormal muscle tone.     Coordination: Coordination normal.     Deep Tendon Reflexes: Reflexes normal.  Psychiatric:        Behavior: Behavior normal.        Thought Content: Thought content normal.        Judgment: Judgment normal.    Fluid behind B TMs   Lab Results  Component Value Date   WBC 7.3 09/19/2015   HGB 12.8 09/19/2015   HCT 37.2 09/19/2015   PLT 324.0 09/19/2015   GLUCOSE 157 (H) 04/01/2017   CHOL 179 05/11/2017   TRIG 104.0 05/11/2017   HDL 49.30 05/11/2017   LDLDIRECT 122.0 04/01/2017   LDLCALC 109 (H) 05/11/2017   ALT 25 04/01/2017   AST 23 04/01/2017   NA 138 04/01/2017   K 3.5 05/11/2017   CL 99 04/01/2017   CREATININE 0.95 04/01/2017   BUN 16 04/01/2017   CO2 29 04/01/2017   TSH 1.42 09/19/2015   INR 0.9 04/11/2007   HGBA1C 6.4 (A) 05/05/2018   MICROALBUR <0.7 04/01/2017    Mm 3d Screen Breast Bilateral  Result Date: 02/01/2018 CLINICAL DATA:  Screening. EXAM: DIGITAL SCREENING BILATERAL MAMMOGRAM WITH TOMO AND CAD COMPARISON:  Previous exam(s). ACR Breast Density Category b: There are scattered areas of fibroglandular density. FINDINGS: There are no findings suspicious for malignancy. Images were processed with CAD. IMPRESSION: No mammographic evidence of malignancy. A result letter of this screening mammogram will be mailed directly to the patient. RECOMMENDATION: Screening mammogram in one year. (Code:SM-B-01Y) BI-RADS CATEGORY  1: Negative. Electronically Signed   By: Claudie Revering M.D.   On: 02/01/2018 09:05    Assessment & Plan:   There are no diagnoses linked to this encounter.   No orders of the defined types were placed in this encounter.    Follow-up: No follow-ups on file.  Walker Kehr, MD

## 2019-01-07 ENCOUNTER — Other Ambulatory Visit: Payer: Self-pay | Admitting: Internal Medicine

## 2019-02-09 ENCOUNTER — Ambulatory Visit: Payer: Managed Care, Other (non HMO) | Admitting: Internal Medicine

## 2019-02-12 ENCOUNTER — Other Ambulatory Visit: Payer: Self-pay | Admitting: Obstetrics and Gynecology

## 2019-02-12 DIAGNOSIS — Z1231 Encounter for screening mammogram for malignant neoplasm of breast: Secondary | ICD-10-CM

## 2019-02-21 ENCOUNTER — Other Ambulatory Visit: Payer: Self-pay | Admitting: Internal Medicine

## 2019-02-25 ENCOUNTER — Other Ambulatory Visit: Payer: Self-pay | Admitting: Internal Medicine

## 2019-03-20 ENCOUNTER — Other Ambulatory Visit: Payer: Self-pay

## 2019-03-22 ENCOUNTER — Ambulatory Visit (INDEPENDENT_AMBULATORY_CARE_PROVIDER_SITE_OTHER): Payer: Managed Care, Other (non HMO) | Admitting: Internal Medicine

## 2019-03-22 ENCOUNTER — Encounter: Payer: Self-pay | Admitting: Internal Medicine

## 2019-03-22 ENCOUNTER — Other Ambulatory Visit: Payer: Self-pay

## 2019-03-22 VITALS — BP 106/60 | HR 85 | Ht 64.0 in | Wt 177.2 lb

## 2019-03-22 DIAGNOSIS — E119 Type 2 diabetes mellitus without complications: Secondary | ICD-10-CM

## 2019-03-22 DIAGNOSIS — Z23 Encounter for immunization: Secondary | ICD-10-CM | POA: Diagnosis not present

## 2019-03-22 LAB — COMPLETE METABOLIC PANEL WITH GFR
AG Ratio: 1.9 (calc) (ref 1.0–2.5)
ALT: 14 U/L (ref 6–29)
AST: 16 U/L (ref 10–35)
Albumin: 4.5 g/dL (ref 3.6–5.1)
Alkaline phosphatase (APISO): 49 U/L (ref 37–153)
BUN: 17 mg/dL (ref 7–25)
CO2: 31 mmol/L (ref 20–32)
Calcium: 9.6 mg/dL (ref 8.6–10.4)
Chloride: 101 mmol/L (ref 98–110)
Creat: 0.92 mg/dL (ref 0.50–1.05)
GFR, Est African American: 80 mL/min/{1.73_m2} (ref >=60)
GFR, Est Non African American: 69 mL/min/{1.73_m2} (ref >=60)
Globulin: 2.4 g/dL (calc) (ref 1.9–3.7)
Glucose, Bld: 128 mg/dL — ABNORMAL HIGH (ref 65–99)
Potassium: 3.5 mmol/L (ref 3.5–5.3)
Sodium: 141 mmol/L (ref 135–146)
Total Bilirubin: 0.4 mg/dL (ref 0.2–1.2)
Total Protein: 6.9 g/dL (ref 6.1–8.1)

## 2019-03-22 LAB — POCT GLYCOSYLATED HEMOGLOBIN (HGB A1C): Hemoglobin A1C: 6.6 % — AB (ref 4.0–5.6)

## 2019-03-22 LAB — GLUCOSE, POCT (MANUAL RESULT ENTRY): POC Glucose: 147 mg/dl — AB (ref 70–99)

## 2019-03-22 NOTE — Progress Notes (Signed)
Patient ID: Elizabeth Ware, female   DOB: July 28, 1960, 58 y.o.   MRN: [id number]  HPI: Elizabeth Ware is a 58 y.o.-year-old female, presenting for f/u for DM2, prev GDM, dx in 05/2014, non-insulin-dependent, uncontrolled, without complications. Last visit 4 months ago (virtual).  Since last visit, she relaxed her diet for a period of time (she did not check sugars then), but she is now back on track.   Reviewed HbA1c levels: Lab Results  Component Value Date   HGBA1C 6.4 (A) 05/05/2018   HGBA1C 6.4 (A) 10/28/2017   HGBA1C 6.0 04/01/2017   Pt is on a regimen of: - Glyxambi (empagliflozin-linagliptin) 25-5 mg daily before b'fast. At last visit, we stopped glipizide XL 5 mg in a.m. >> she is feeling much better. Tried Metformin before >> nausea.  She is checking CBGs once a day: - am:  100-105 >> 100-110 >> 98-110 >> 90s-120 - 2h after b'fast: n/c  - before lunch: n/c - 2h after lunch: 172-200 >> 160 >> n/c >> 160 >> n/c - before dinner: n/c - 2h after dinner: n/c - bedtime: 90-105 >> 108-125 >> 100-120s >> n/c - nighttime: n/c Lowest sugar was 90s >> 98 >> 90; it is unclear which level she has hypoglycemia awareness Highest sugar was 125 >> 160 >> 185 x1.  Glucometer: One Touch Verio  Pt's meals are: - Breakfast: 2 Eggos + fruit - Lunch: pita tuna; [employer]; yoghurt - Dinner: meat + veggies + spaghetti - Snacks: dill pickles, nuts, chocolate, potato chips, grapes She saw nutrition in the past: Helped  She was exercising at the gym: Body pump, Zumba, but stopped due to the coronavirus pandemic.  She restarted 5 mo ago.  -No CKD, last BUN/creatinine:  Lab Results  Component Value Date   BUN 16 04/01/2017   CREATININE 0.95 04/01/2017  On olmesartan.  -+ HL; latest set of lipids: Lab Results  Component Value Date   CHOL 179 05/11/2017   HDL 49.30 05/11/2017   LDLCALC 109 (H) 05/11/2017   LDLDIRECT 122.0 04/01/2017   TRIG 104.0 05/11/2017   CHOLHDL 4 05/11/2017  She did  not start the statin as suggested.  - last eye exam was in 12/2018: No DR.  Dr. Lawerance Bach in [facility].  -She did not numbness and tingling in her feet.  She has a h/o diverticulitis >> part of colon removed in 2008.  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  ROS: Constitutional: no weight gain/no weight loss, no fatigue, no subjective hyperthermia, no subjective hypothermia Eyes: no blurry vision, no xerophthalmia ENT: no sore throat, no nodules palpated in neck, no dysphagia, no odynophagia, no hoarseness Cardiovascular: no CP/no SOB/no palpitations/no leg swelling Respiratory: no cough/no SOB/no wheezing Gastrointestinal: no N/no V/no D/no C/no acid reflux Musculoskeletal: no muscle aches/no joint aches Skin: no rashes, no hair loss Neurological: no tremors/no numbness/no tingling/no dizziness  I reviewed pt's medications, allergies, PMH, social hx, family hx, and changes were documented in the history of present illness. Otherwise, unchanged from my initial visit note.  Past Medical History:  Diagnosis Date  . Diabetes mellitus without complication (HCC)   . Diverticulitis   . Hypertension    Past Surgical History:  Procedure Laterality Date  . COLON SURGERY     History   Social History  . Marital Status: Married    Spouse Name: N/A  . Number of Children: 1   Occupational History  .  Social History Main Topics  . Smoking status: Never Smoker   . Smokeless tobacco: Not on file  . Alcohol Use: No  . Drug Use: No   Current Outpatient Medications on File Prior to Visit  Medication Sig Dispense Refill  . clorazepate (TRANXENE) 7.5 MG tablet TAKE 1 TABLET BY MOUTH TWICE A DAY AS NEEDED FOR ANXIETY/SLEEP 60 tablet 1  . Empagliflozin-linaGLIPtin (GLYXAMBI) 25-5 MG TABS Take 1 tablet by mouth daily. 90 tablet 3  . fluticasone (FLONASE) 50 MCG/ACT nasal spray Place 2 sprays into both nostrils daily. 16 g 1  .  hydrochlorothiazide (HYDRODIURIL) 25 MG tablet Take 1 tablet (25 mg total) by mouth daily. 90 tablet 3  . olmesartan (BENICAR) 40 MG tablet TAKE 1 TABLET BY MOUTH EVERY DAY 90 tablet 3  . ONETOUCH DELICA LANCETS FINE MISC 1 Device by Does not apply route daily as needed. 50 each 3  . ONETOUCH VERIO test strip USE TO CHECK BLOOD SUGAR THREE TIMES A DAY 300 strip 1  . oxymetazoline (AFRIN NASAL SPRAY) 0.05 % nasal spray Place 1 spray into both nostrils 2 (two) times daily. 30 mL 0  . pantoprazole (PROTONIX) 40 MG tablet     . terbinafine (LAMISIL) 250 MG tablet Take 1 tablet (250 mg total) by mouth daily. 90 tablet 0   No current facility-administered medications on file prior to visit.    Allergies  Allergen Reactions  . Cetirizine Hcl     REACTION: fatigue  . Metformin And Related Nausea And Vomiting  . Penicillins Nausea And Vomiting  . Valsartan     REACTION: rash   Family History  Problem Relation Age of Onset  . Diabetes Mother   . Stroke Mother   . Cancer Father        colon or liver  Thyroid ds. And HTN in Mother.  PE: BP 106/60 (BP Location: Left Arm, Patient Position: Sitting, Cuff Size: Normal)   Pulse 85   Ht  (1.626 m)   Wt 177 lb 3.2 oz (80.4 kg)   SpO2 96%   BMI 30.42 kg/m  Body mass index is 30.42 kg/m.  Wt Readings from Last 3 Encounters:  03/22/19 177 lb 3.2 oz (80.4 kg)  12/14/18 176 lb (79.8 kg)  12/01/18 176 lb (79.8 kg)   Constitutional: overweight, in NAD Eyes: PERRLA, EOMI, no exophthalmos ENT: moist mucous membranes, no thyromegaly, no cervical lymphadenopathy Cardiovascular: RRR, No MRG Respiratory: CTA B Gastrointestinal: abdomen soft, NT, ND, BS+ Musculoskeletal: no deformities, strength intact in all 4 Skin: moist, warm, no rashes Neurological: no tremor with outstretched hands, DTR normal in all 4  ASSESSMENT: 1. DM2, non-insulin-dependent, uncontrolled, without long term complications  2. HL  3. Overweight  PLAN:  1.  Patient with history of controlled diabetes on oral antidiabetic regimen with DPP 4 and SGLT 2 inhibitor.  In the past she was also on glipizide XL which we were able to stop due to improved blood sugars after starting going to the gym and exercising consistently and also improving her diet.  At last visit, sugars are still at goal even though she was not going to the gym due to the coronavirus pandemic.  She was telling me that she felt better after stopping glipizide, with less dizziness and less mental fog.  We continued on the Glyxambi at that time. -At this visit, she only checks sugars in the morning and is not at goal, only slightly higher compared to before.  I advised  her to also rotate checks later in the day to detect any possible trends.  At this visit, her HbA1c is already slightly higher, at 6.6%  -Other than starting to check sugars consistently, rotating check times, I do not feel that we need to change her regimen now -I suggested to:  Patient Instructions  Please continue: - Glyxambi 25-5 mg daily before b'fast.  Check sugars once a day, rotating check times.  Please return in 4 months with your sugar log  - advised to check sugars at different times of the day - 1x a day, rotating check times - advised for yearly eye exams >> she is UTD -+ Flu shot today -+ Annual labs today - return to clinic in 4 monthswith her sugar logs  2. HL -Reviewed latest lipid panel from 2019: LDL improved, but still above goal Lab Results  Component Value Date   CHOL 179 05/11/2017   HDL 49.30 05/11/2017   LDLCALC 109 (H) 05/11/2017   LDLDIRECT 122.0 04/01/2017   TRIG 104.0 05/11/2017   CHOLHDL 4 05/11/2017  -I did suggest a statin in the past but she declined -She is due for another lipid panel today-nonfasting  3. Overweight -Continue SGLT 2 inhibitor which should also help with weight loss -No significant weight gain since last visit.  However, she mentions that she did gain field  pounds, which she was able to lose after improving her diet and restarting exercise  Component     Latest Ref Rng & Units 03/22/2019  Glucose     65 - 99 mg/dL 253128 (H)  BUN     7 - 25 mg/dL 17  Creatinine     6.640.50 - 1.05 mg/dL 4.030.92  GFR, Est Non African American     > OR = 60 mL/min/1.2373m2 69  GFR, Est African American     > OR = 60 mL/min/1.1473m2 80  BUN/Creatinine Ratio     6 - 22 (calc) NOT APPLICABLE  Sodium     135 - 146 mmol/L 141  Potassium     3.5 - 5.3 mmol/L 3.5  Chloride     98 - 110 mmol/L 101  CO2     20 - 32 mmol/L 31  Calcium     8.6 - 10.4 mg/dL 9.6  Total Protein     6.1 - 8.1 g/dL 6.9  Albumin MSPROF     3.6 - 5.1 g/dL 4.5  Globulin     1.9 - 3.7 g/dL (calc) 2.4  AG Ratio     1.0 - 2.5 (calc) 1.9  Total Bilirubin     0.2 - 1.2 mg/dL 0.4  Alkaline phosphatase (APISO)     37 - 153 U/L 49  AST     10 - 35 U/L 16  ALT     6 - 29 U/L 14  Cholesterol     0 - 200 mg/dL 474203 (H)  Triglycerides     0.0 - 149.0 mg/dL 259.5194.0 (H)  HDL Cholesterol     >39.00 mg/dL 63.8744.70  VLDL     0.0 - 56.440.0 mg/dL 33.238.8  LDL (calc)     0 - 99 mg/dL 951120 (H)  Total CHOL/HDL Ratio      5  NonHDL      158.36  Microalb, Ur     0.0 - 1.9 mg/dL <8.8<0.7  Creatinine,U     mg/dL 41.643.6  MICROALB/CREAT RATIO     0.0 - 30.0 mg/g 1.6  POC Glucose  70 - 99 mg/dl 147 (A)   Labs are ok with the exception of slightly high triglycerides and also higher LDL.  I will again suggest a statin.  Philemon Kingdom, MD PhD [facility]

## 2019-03-22 NOTE — Patient Instructions (Addendum)
Please continue: - Glyxambi 25-5 mg daily before b'fast.  Check sugars once a day, rotating check times.  Please return in 4 months with your sugar log. 

## 2019-03-23 LAB — LIPID PANEL
Cholesterol: 203 mg/dL — ABNORMAL HIGH (ref 0–200)
HDL: 44.7 mg/dL (ref >39.00)
LDL Cholesterol: 120 mg/dL — ABNORMAL HIGH (ref 0–99)
NonHDL: 158.36
Total CHOL/HDL Ratio: 5
Triglycerides: 194 mg/dL — ABNORMAL HIGH (ref 0.0–149.0)
VLDL: 38.8 mg/dL (ref 0.0–40.0)

## 2019-03-23 LAB — MICROALBUMIN / CREATININE URINE RATIO
Creatinine,U: 43.6 mg/dL
Microalb Creat Ratio: 1.6 mg/g (ref 0.0–30.0)
Microalb, Ur: <0.7 mg/dL (ref 0.0–1.9)

## 2019-03-26 ENCOUNTER — Telehealth: Payer: Self-pay

## 2019-03-26 MED ORDER — ROSUVASTATIN CALCIUM 5 MG PO TABS: 5.0000 mg | ORAL_TABLET | Freq: Every day | ORAL | 3 refills | Status: DC

## 2019-03-26 NOTE — Telephone Encounter (Signed)
Notified patient of message from Dr. Cruzita Lederer, patient expressed understanding and agreement. No further questions.  RX sent to [facility] per patient request.

## 2019-03-26 NOTE — Telephone Encounter (Signed)
-----   Message from Philemon Kingdom, MD sent at 03/23/2019  5:28 PM EDT ----- Lenna Sciara, can you please call pt: Labs are ok with the exception of slightly high triglycerides and also higher LDL (bad cholesterol).  I will suggest a statin.  Would she agreed to start rosuvastatin 5 mg daily?.  If so, let's send this to her pharmacy.

## 2019-03-28 ENCOUNTER — Ambulatory Visit
Admission: RE | Admit: 2019-03-28 | Discharge: 2019-03-28 | Disposition: A | Discharge status: Home / Self Care | Payer: Managed Care, Other (non HMO) | Source: Ambulatory Visit | Attending: Obstetrics and Gynecology | Admitting: Obstetrics and Gynecology

## 2019-03-28 ENCOUNTER — Other Ambulatory Visit: Payer: Self-pay

## 2019-03-28 DIAGNOSIS — Z1231 Encounter for screening mammogram for malignant neoplasm of breast: Secondary | ICD-10-CM

## 2019-03-30 ENCOUNTER — Other Ambulatory Visit: Payer: Self-pay | Admitting: Obstetrics and Gynecology

## 2019-03-30 DIAGNOSIS — R928 Other abnormal and inconclusive findings on diagnostic imaging of breast: Secondary | ICD-10-CM

## 2019-04-03 ENCOUNTER — Other Ambulatory Visit: Payer: Self-pay

## 2019-04-03 ENCOUNTER — Ambulatory Visit
Admission: RE | Admit: 2019-04-03 | Discharge: 2019-04-03 | Disposition: A | Discharge status: Home / Self Care | Payer: Managed Care, Other (non HMO) | Source: Ambulatory Visit | Attending: Obstetrics and Gynecology | Admitting: Obstetrics and Gynecology

## 2019-04-03 DIAGNOSIS — R928 Other abnormal and inconclusive findings on diagnostic imaging of breast: Secondary | ICD-10-CM

## 2019-05-29 ENCOUNTER — Telehealth: Payer: Self-pay

## 2019-05-29 MED ORDER — ROSUVASTATIN CALCIUM 5 MG PO TABS: 5.0000 mg | ORAL_TABLET | Freq: Every day | ORAL | 3 refills | Status: DC

## 2019-05-29 NOTE — Telephone Encounter (Signed)
MEDICATION:rosuvastatin (CRESTOR) 5 MG tablet  PHARMACY:  [id number] - [place], [place] - [address]  IS THIS A 90 DAY SUPPLY : yes   IS PATIENT OUT OF MEDICATION:   IF NOT; HOW MUCH IS LEFT:   LAST APPOINTMENT DATE: @11 /06/2018  NEXT APPOINTMENT DATE:@3 /08/2019  DO WE HAVE YOUR PERMISSION TO LEAVE A DETAILED MESSAGE:  OTHER COMMENTS:    **Let patient know to contact pharmacy at the end of the day to make sure medication is ready. **  ** Please notify patient to allow 48-72 hours to process**  **Encourage patient to contact the pharmacy for refills or they can request refills through [facility]**

## 2019-05-29 NOTE — Telephone Encounter (Signed)
RX sent

## 2019-07-26 ENCOUNTER — Ambulatory Visit (INDEPENDENT_AMBULATORY_CARE_PROVIDER_SITE_OTHER): Payer: Managed Care, Other (non HMO) | Admitting: Internal Medicine

## 2019-07-26 ENCOUNTER — Other Ambulatory Visit: Payer: Self-pay

## 2019-07-26 ENCOUNTER — Encounter: Payer: Self-pay | Admitting: Internal Medicine

## 2019-07-26 VITALS — BP 108/60 | HR 105 | Ht 64.0 in | Wt 175.0 lb

## 2019-07-26 DIAGNOSIS — E785 Hyperlipidemia, unspecified: Secondary | ICD-10-CM

## 2019-07-26 DIAGNOSIS — E119 Type 2 diabetes mellitus without complications: Secondary | ICD-10-CM

## 2019-07-26 DIAGNOSIS — E669 Obesity, unspecified: Secondary | ICD-10-CM | POA: Diagnosis not present

## 2019-07-26 NOTE — Progress Notes (Signed)
Patient ID: Elizabeth Ware, female   DOB: 1960-08-14, 59 y.o.   MRN: [id number]  This visit occurred during the SARS-CoV-2 public health emergency.  Safety protocols were in place, including screening questions prior to the visit, additional usage of staff PPE, and extensive cleaning of exam room while observing appropriate contact time as indicated for disinfecting solutions.   HPI: Elizabeth Ware is a 59 y.o.-year-old female, presenting for f/u for DM2, prev GDM, dx in 05/2014, non-insulin-dependent, uncontrolled, without complications. Last visit 4 months ago.  Reviewed HbA1c levels: Lab Results  Component Value Date   HGBA1C 6.6 (A) 03/22/2019   HGBA1C 6.4 (A) 05/05/2018   HGBA1C 6.4 (A) 10/28/2017   Pt is on a regimen of: - Glyxambi (empagliflozin-linagliptin) 25-5 mg daily before b'fast. At last visit, we stopped glipizide XL 5 mg in a.m. >> she is feeling much better. Tried Metformin before >> nausea.  She is checking CBGs 1-2x a week: - am:  100-105 >> 100-110 >> 98-110 >> 90s-120 >> 96, 98, 114-120 - 2h after b'fast: n/c  - before lunch: n/c - 2h after lunch: 172-200 >> 160 >> n/c >> 160 >> n/c - before dinner: n/c - 2h after dinner: n/c - bedtime: 90-105 >> 108-125 >> 100-120s >> n/c >> 106-135 - nighttime: n/c Lowest sugar was 90s >> 98 >> 90 >> 96; it is unclear at which level she has hypoglycemia awareness. Highest sugar was 125 >> 160 >> 185 x1 >> 160.  Glucometer: One Touch Verio  Pt's meals are: - Breakfast: 2 Eggos + fruit - Lunch: pita tuna; [employer]; yoghurt - Dinner: meat + veggies + spaghetti - Snacks: dill pickles, nuts, chocolate, potato chips, grapes She saw nutrition in the past: Helped  She started exercising at the gym 10 years ago: Body pump, Zumba, but stopped due to the coronavirus pandemic.  She restarted 5 months before our last appointment.  She continues now.  -No CKD, last BUN/creatinine:  Lab Results  Component Value Date   BUN 17 03/22/2019    CREATININE 0.92 03/22/2019  On olmesartan.  -+ HL; latest set of lipids: Lab Results  Component Value Date   CHOL 203 (H) 03/22/2019   HDL 44.70 03/22/2019   LDLCALC 120 (H) 03/22/2019   LDLDIRECT 122.0 04/01/2017   TRIG 194.0 (H) 03/22/2019   CHOLHDL 5 03/22/2019  We started Crestor 5 02/2019.  She takes it every other day due to mental fog and fatigue.  - last eye exam was in 12/2018: No DR.  Dr. Lawerance Bach in [facility].  -no numbness and tingling in her feet.  She has a h/o diverticulitis >> part of colon removed in 2008.  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  ROS: Constitutional: no weight gain/no weight loss, no fatigue, no subjective hyperthermia, no subjective hypothermia Eyes: no blurry vision, no xerophthalmia ENT: no sore throat, no nodules palpated in neck, no dysphagia, no odynophagia, no hoarseness Cardiovascular: no CP/no SOB/no palpitations/no leg swelling Respiratory: no cough/no SOB/no wheezing Gastrointestinal: no N/no V/no D/no C/no acid reflux Musculoskeletal: no muscle aches/no joint aches Skin: no rashes, no hair loss Neurological: no tremors/no numbness/no tingling/no dizziness  I reviewed pt's medications, allergies, PMH, social hx, family hx, and changes were documented in the history of present illness. Otherwise, unchanged from my initial visit note.  Past Medical History:  Diagnosis Date  . Diabetes mellitus without complication (HCC)   .  Diverticulitis   . Hypertension    Past Surgical History:  Procedure Laterality Date  . COLON SURGERY     History   Social History  . Marital Status: Married    Spouse Name: N/A  . Number of Children: 1   Occupational History  .    Social History Main Topics  . Smoking status: Never Smoker   . Smokeless tobacco: Not on file  . Alcohol Use: No  . Drug Use: No   Current Outpatient Medications on File Prior to Visit  Medication Sig Dispense Refill   . clorazepate (TRANXENE) 7.5 MG tablet TAKE 1 TABLET BY MOUTH TWICE A DAY AS NEEDED FOR ANXIETY/SLEEP 60 tablet 1  . Empagliflozin-linaGLIPtin (GLYXAMBI) 25-5 MG TABS Take 1 tablet by mouth daily. 90 tablet 3  . fluticasone (FLONASE) 50 MCG/ACT nasal spray Place 2 sprays into both nostrils daily. 16 g 1  . hydrochlorothiazide (HYDRODIURIL) 25 MG tablet Take 1 tablet (25 mg total) by mouth daily. 90 tablet 3  . olmesartan (BENICAR) 40 MG tablet TAKE 1 TABLET BY MOUTH EVERY DAY 90 tablet 3  . ONETOUCH DELICA LANCETS FINE MISC 1 Device by Does not apply route daily as needed. 50 each 3  . ONETOUCH VERIO test strip USE TO CHECK BLOOD SUGAR THREE TIMES A DAY 300 strip 1  . oxymetazoline (AFRIN NASAL SPRAY) 0.05 % nasal spray Place 1 spray into both nostrils 2 (two) times daily. 30 mL 0  . pantoprazole (PROTONIX) 40 MG tablet     . rosuvastatin (CRESTOR) 5 MG tablet Take 1 tablet (5 mg total) by mouth daily. 90 tablet 3  . terbinafine (LAMISIL) 250 MG tablet Take 1 tablet (250 mg total) by mouth daily. 90 tablet 0   No current facility-administered medications on file prior to visit.   Allergies  Allergen Reactions  . Cetirizine Hcl     REACTION: fatigue  . Metformin And Related Nausea And Vomiting  . Penicillins Nausea And Vomiting  . Valsartan     REACTION: rash   Family History  Problem Relation Age of Onset  . Diabetes Mother   . Stroke Mother   . Cancer Father        colon or liver  Thyroid ds. And HTN in Mother.  PE: BP 108/60   Pulse (!) 105   Ht 5\' 4"  (1.626 m)   Wt 175 lb (79.4 kg)   SpO2 96%   BMI 30.04 kg/m  Body mass index is 30.04 kg/m.  Wt Readings from Last 3 Encounters:  07/26/19 175 lb (79.4 kg)  03/22/19 177 lb 3.2 oz (80.4 kg)  12/14/18 176 lb (79.8 kg)   Constitutional: overweight, in NAD Eyes: PERRLA, EOMI, no exophthalmos ENT: moist mucous membranes, no thyromegaly, no cervical lymphadenopathy Cardiovascular: tachycardia; RR, No MRG Respiratory: CTA  B Gastrointestinal: abdomen soft, NT, ND, BS+ Musculoskeletal: no deformities, strength intact in all 4 Skin: moist, warm, no rashes Neurological: no tremor with outstretched hands, DTR normal in all 4  ASSESSMENT: 1. DM2, non-insulin-dependent, uncontrolled, without long term complications  2. HL  3. Overweight  PLAN:  1. Patient with history of controlled diabetes on oral antidiabetic regimen we DPP 4 inhibitor and SGLT 2 inhibitor.  In the past she was also on glipizide XL, which we were able to stop due to improved blood sugars after going to the gym and exercising consistently and also improving her diet.  She felt better after stopping glipizide, with less mental fog and dizziness.  At last visit, her HbA1c was slightly higher, at 6.6%.  At that time she was only checking sugars in the morning and they were slightly higher compared to before.  I advised her to check sugars later in the day, also.  We did not change her regimen at that time. -At this visit she tells me that she is only checking sugars once or twice a week.  They are at goal, but slightly higher than before.  I advised her to check every day, rotating check times, especially that her HbA1c: 7.1% (higher) -For now, she is reticent to escalate her diabetes regimen so for now, we will continue Glyxambi. -I suggested to:  Patient Instructions  Please continue: - Glyxambi 25-5 mg daily before b'fast.  Check sugars once a day, rotating check times.  Please stop at the lab.  Please return in 4 months with your sugar log.  - advised to check sugars at different times of the day - 1x a day, rotating check times - advised for yearly eye exams >> she is UTD - return to clinic in 4 months  2. HL -Review latest lipid panel from 02/2019: LDL above target.  We started Crestor 5 mg daily at that time >> now qod - 2/2 metal fog, fatigue. Lab Results  Component Value Date   CHOL 203 (H) 03/22/2019   HDL 44.70 03/22/2019    LDLCALC 120 (H) 03/22/2019   LDLDIRECT 122.0 04/01/2017   TRIG 194.0 (H) 03/22/2019   CHOLHDL 5 03/22/2019  -We will recheck lipid panel today  3. Obesity class 1 -Continue SGLT 2 inhibitor which should also help with weight loss -Lost 2 pounds since last visit  Office Visit on 07/26/2019  Component Date Value Ref Range Status  . Cholesterol 07/26/2019 169  0 - 200 mg/dL Final   ATP III Classification       Desirable:  < 200 mg/dL               Borderline High:  200 - 239 mg/dL          High:  > = 092 mg/dL  . Triglycerides 07/26/2019 166.0* 0.0 - 149.0 mg/dL Final   Normal:  <330 mg/dLBorderline High:  150 - 199 mg/dL  . HDL 07/26/2019 42.90  >39.00 mg/dL Final  . VLDL 07/62/2633 33.2  0.0 - 40.0 mg/dL Final  . LDL Cholesterol 07/26/2019 93  0 - 99 mg/dL Final  . Total CHOL/HDL Ratio 07/26/2019 4   Final                  Men          Women1/2 Average Risk     3.4          3.3Average Risk          5.0          4.42X Average Risk          9.6          7.13X Average Risk          15.0          11.0                      . NonHDL 07/26/2019 126.58   Final   NOTE:  Non-HDL goal should be 30 mg/dL higher than patient's LDL goal (i.e. LDL goal of < 70 mg/dL, would have non-HDL goal of < 100 mg/dL)  . Hemoglobin A1C  07/27/2019 7.1* 4.0 - 5.6 % Final   Lipid panel much improved, with LDL now at goal.  Philemon Kingdom, MD PhD [facility] Endocrinology

## 2019-07-26 NOTE — Patient Instructions (Addendum)
Please continue: - Glyxambi 25-5 mg daily before b'fast.  Check sugars once a day, rotating check times.  Please stop at the lab.  Please return in 4 months with your sugar log.

## 2019-07-27 ENCOUNTER — Telehealth: Payer: Self-pay

## 2019-07-27 LAB — POCT GLYCOSYLATED HEMOGLOBIN (HGB A1C): Hemoglobin A1C: 7.1 % — AB (ref 4.0–5.6)

## 2019-07-27 LAB — LIPID PANEL
Cholesterol: 169 mg/dL (ref 0–200)
HDL: 42.9 mg/dL (ref >39.00)
LDL Cholesterol: 93 mg/dL (ref 0–99)
NonHDL: 126.58
Total CHOL/HDL Ratio: 4
Triglycerides: 166 mg/dL — ABNORMAL HIGH (ref 0.0–149.0)
VLDL: 33.2 mg/dL (ref 0.0–40.0)

## 2019-07-27 NOTE — Telephone Encounter (Signed)
Notified patient of message from Dr. Gherghe, patient expressed understanding and agreement. No further questions.  

## 2019-07-27 NOTE — Telephone Encounter (Signed)
-----   Message from Carlus Pavlov, MD sent at 07/27/2019 12:52 PM EST ----- Efraim Kaufmann, can you please call pt: Her lipid panel is much better good cholesterol and triglycerides only slightly high.

## 2019-09-23 ENCOUNTER — Other Ambulatory Visit: Payer: Self-pay | Admitting: Internal Medicine

## 2019-10-08 ENCOUNTER — Other Ambulatory Visit: Payer: Self-pay | Admitting: Internal Medicine

## 2019-10-15 ENCOUNTER — Other Ambulatory Visit: Payer: Self-pay | Admitting: Internal Medicine

## 2019-12-06 ENCOUNTER — Other Ambulatory Visit: Payer: Self-pay

## 2019-12-06 ENCOUNTER — Ambulatory Visit (INDEPENDENT_AMBULATORY_CARE_PROVIDER_SITE_OTHER): Payer: Managed Care, Other (non HMO) | Admitting: Internal Medicine

## 2019-12-06 ENCOUNTER — Encounter: Payer: Self-pay | Admitting: Internal Medicine

## 2019-12-06 VITALS — BP 118/62 | HR 91 | Ht 64.0 in | Wt 175.0 lb

## 2019-12-06 DIAGNOSIS — E119 Type 2 diabetes mellitus without complications: Secondary | ICD-10-CM

## 2019-12-06 DIAGNOSIS — E785 Hyperlipidemia, unspecified: Secondary | ICD-10-CM | POA: Diagnosis not present

## 2019-12-06 DIAGNOSIS — E669 Obesity, unspecified: Secondary | ICD-10-CM | POA: Diagnosis not present

## 2019-12-06 LAB — POCT GLYCOSYLATED HEMOGLOBIN (HGB A1C): Hemoglobin A1C: 7.1 % — AB (ref 4.0–5.6)

## 2019-12-06 NOTE — Progress Notes (Signed)
Patient ID: Elizabeth Ware, female   DOB: [birth year], 59 y.o.   MRN: [id number]  This visit occurred during the SARS-CoV-2 public health emergency.  Safety protocols were in place, including screening questions prior to the visit, additional usage of staff PPE, and extensive cleaning of exam room while observing appropriate contact time as indicated for disinfecting solutions.   HPI: Elizabeth Ware is a 59 y.o.-year-old female, presenting for f/u for DM2, prev GDM, dx in 05/2014, non-insulin-dependent, uncontrolled, without complications. Last visit 4 months ago.  Reviewed HbA1c levels: Lab Results  Component Value Date   HGBA1C 7.1 (A) 07/27/2019   HGBA1C 6.6 (A) 03/22/2019   HGBA1C 6.4 (A) 05/05/2018   Pt is on a regimen of: - Glyxambi (empagliflozin-linagliptin) 25-5 mg daily before breakfast At last visit, we stopped glipizide XL 5 mg in a.m. >> she is feeling much better. She had nausea with Metformin before.  She is checking CBGs 1-2 times a week: - am:   98-110 >> 90s-120 >> 96, 98, 114-120 >> 90s-120 - 2h after b'fast: n/c  - before lunch: n/c - 2h after lunch: 172-200 >> 160 >> n/c >> 160 >> n/c - before dinner: n/c - 2h after dinner: n/c - bedtime:  108-125 >> 100-120s >> n/c >> 106-135 >> 130-160 - nighttime: n/c Lowest sugar was 90s >> 98 >> 90 >> 96 >> 90; it is unclear at which level she has hypoglycemia awareness. Highest sugar was 125 >> 160 >> 185 x1 >> 160 >> 160.  Glucometer: One Touch Verio  Pt's meals are: - Breakfast: 2 Eggos + fruit - Lunch: pita tuna; [employer]; yoghurt - Dinner: meat + veggies + spaghetti - Snacks: dill pickles, nuts, chocolate, potato chips, grapes She saw nutrition in the past: Helped  She started exercising at the gym 10 years ago: Body pump, Zumba, but stopped due to the coronavirus pandemic.  However, she restarted at the end of last year and she continues with this - [employer].  -No CKD, last BUN/creatinine:  Lab Results  Component  Value Date   BUN 17 03/22/2019   CREATININE 0.92 03/22/2019  On olmesartan.  -+ HL; latest set of lipids: Lab Results  Component Value Date   CHOL 169 07/26/2019   HDL 42.90 07/26/2019   LDLCALC 93 07/26/2019   LDLDIRECT 122.0 04/01/2017   TRIG 166.0 (H) 07/26/2019   CHOLHDL 4 07/26/2019  We started Crestor 5 02/2019.  She takes it 2/7 day due to mental fog and fatigue.  - last eye exam was in 12/2018: No DR.  Dr. Lawerance Bach in [place] [facility].  - nonumbness and tingling in her feet.  She has a h/o diverticulitis >> part of colon removed in 2008.  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  Her husband is competing in bowling tournaments.    She just returned from a trip to [facility].  ROS: Constitutional: no weight gain/no weight loss, no fatigue, no subjective hyperthermia, no subjective hypothermia Eyes: no blurry vision, no xerophthalmia ENT: no sore throat, no nodules palpated in neck, no dysphagia, no odynophagia, no hoarseness Cardiovascular: no CP/no SOB/no palpitations/no leg swelling Respiratory: no cough/no SOB/no wheezing Gastrointestinal: no N/no V/no D/no C/no acid reflux Musculoskeletal: no muscle aches/no joint aches Skin: no rashes, no hair loss Neurological: no tremors/no numbness/no tingling/no dizziness  I reviewed pt's medications, allergies, PMH, social hx, family hx, and changes were documented in the  history of present illness. Otherwise, unchanged from my initial visit note.  Past Medical History:  Diagnosis Date  . Diabetes mellitus without complication (HCC)   . Diverticulitis   . Hypertension    Past Surgical History:  Procedure Laterality Date  . COLON SURGERY     History   Social History  . Marital Status: Married    Spouse Name: N/A  . Number of Children: 1   Occupational History  .    Social History Main Topics  . Smoking status: Never Smoker   . Smokeless tobacco: Not on file  . Alcohol  Use: No  . Drug Use: No   Current Outpatient Medications on File Prior to Visit  Medication Sig Dispense Refill  . clorazepate (TRANXENE) 7.5 MG tablet TAKE 1 TABLET BY MOUTH TWICE A DAY AS NEEDED FOR ANXIETY/SLEEP 60 tablet 0  . fluticasone (FLONASE) 50 MCG/ACT nasal spray Place 2 sprays into both nostrils daily. 16 g 1  . GLYXAMBI 25-5 MG TABS TAKE 1 TABLET BY MOUTH EVERY DAY 90 tablet 3  . hydrochlorothiazide (HYDRODIURIL) 25 MG tablet TAKE 1 TABLET BY MOUTH EVERY DAY 90 tablet 3  . olmesartan (BENICAR) 40 MG tablet TAKE 1 TABLET BY MOUTH EVERY DAY 90 tablet 3  . ONETOUCH DELICA LANCETS FINE MISC 1 Device by Does not apply route daily as needed. 50 each 3  . ONETOUCH VERIO test strip USE TO CHECK BLOOD SUGAR THREE TIMES A DAY 300 strip 1  . oxymetazoline (AFRIN NASAL SPRAY) 0.05 % nasal spray Place 1 spray into both nostrils 2 (two) times daily. 30 mL 0  . pantoprazole (PROTONIX) 40 MG tablet     . rosuvastatin (CRESTOR) 5 MG tablet Take 1 tablet (5 mg total) by mouth daily. 90 tablet 3  . terbinafine (LAMISIL) 250 MG tablet Take 1 tablet (250 mg total) by mouth daily. 90 tablet 0   No current facility-administered medications on file prior to visit.   Allergies  Allergen Reactions  . Cetirizine Hcl     REACTION: fatigue  . Metformin And Related Nausea And Vomiting  . Penicillins Nausea And Vomiting  . Valsartan     REACTION: rash   Family History  Problem Relation Age of Onset  . Diabetes Mother   . Stroke Mother   . Cancer Father        colon or liver  Thyroid ds. And HTN in Mother.  PE: BP 118/62   Pulse 91   Ht 5\' 4"  (1.626 m)   Wt 175 lb (79.4 kg)   SpO2 96%   BMI 30.04 kg/m  Body mass index is 30.04 kg/m.  Wt Readings from Last 3 Encounters:  12/06/19 175 lb (79.4 kg)  07/26/19 175 lb (79.4 kg)  03/22/19 177 lb 3.2 oz (80.4 kg)   Constitutional: overweight, in NAD Eyes: PERRLA, EOMI, no exophthalmos ENT: moist mucous membranes, no thyromegaly, no cervical  lymphadenopathy Cardiovascular: RRR, No MRG, + mild nonpitting periankle edema Respiratory: CTA B Gastrointestinal: abdomen soft, NT, ND, BS+ Musculoskeletal: no deformities, strength intact in all 4 Skin: moist, warm, no rashes Neurological: no tremor with outstretched hands, DTR normal in all 4  ASSESSMENT: 1. DM2, non-insulin-dependent, uncontrolled, without long term complications, but with hyperglycemia  2. HL  3. Overweight  PLAN:  1. Patient with history of controlled diabetes on oral antidiabetic regimen with DPP 4 inhibitor and SGLT2 inhibitor combined in the Glyxambi tablet.  In the past, she was also on glipizide XL, but  we were able to stop this due to improved blood sugars after starting to go to the gym and exercising consistently and also improving her diet.  She has less mental fog and dizziness after stopping glipizide.  HbA1c increased at last visit from 6.6% to 7.1% and at that time she was only checking sugars once or twice a week.  The sugars were at goal, but slightly higher than before.  I advised her to check every day, rotating check times we did not change her regimen.  She was reticent to add any medication at that time, wanting to work more on diet and exercise. -At this visit, she is not checking sugars consistently and I again advised her to do so.  She agrees to start.  Given sugar logs. -However, whenever she is checking, sugars are at goal, so for now, we do not need to change her regimen - we checked her HbA1c: 7.1% (stable) -I suggested to:  Patient Instructions  Please continue: - Glyxambi 25-5 mg daily before b'fast.  Check sugars once a day, rotating check times.  Please return in 4 months with your sugar log.   - advised to check sugars at different times of the day - 1x a day, rotating check times - advised for yearly eye exams >> she is UTD - return to clinic in 4 months  2. HL -Reviewed latest lipid panel from 07/2019: Lipid fractions at  goal, with only slightly high triglycerides: Lab Results  Component Value Date   CHOL 169 07/26/2019   HDL 42.90 07/26/2019   LDLCALC 93 07/26/2019   LDLDIRECT 122.0 04/01/2017   TRIG 166.0 (H) 07/26/2019   CHOLHDL 4 07/26/2019  -Continue Crestor 5 mg -however, she is taking it only 2 out of 7 days, approximately and we discussed to try to move it every other day if possible.  She had mental fog and fatigue from taking it daily.  3. Obesity class 1 -Continue SGLT therapy should also help with weight loss -She lost 2 pounds before last visit, stable weight since last OV   Carlus Pavlov, MD PhD [facility] Endocrinology

## 2019-12-06 NOTE — Patient Instructions (Signed)
Please continue: - Glyxambi 25-5 mg daily before b'fast.  Check sugars once a day, rotating check times.  Please return in 4 months with your sugar log.

## 2019-12-28 ENCOUNTER — Other Ambulatory Visit: Payer: Self-pay | Admitting: Internal Medicine

## 2019-12-31 LAB — HM DIABETES EYE EXAM

## 2020-01-11 ENCOUNTER — Encounter: Payer: Self-pay | Admitting: Internal Medicine

## 2020-03-16 ENCOUNTER — Other Ambulatory Visit: Payer: Self-pay | Admitting: Internal Medicine

## 2020-03-26 ENCOUNTER — Other Ambulatory Visit: Payer: Self-pay | Admitting: Obstetrics and Gynecology

## 2020-03-26 DIAGNOSIS — Z1231 Encounter for screening mammogram for malignant neoplasm of breast: Secondary | ICD-10-CM

## 2020-04-03 ENCOUNTER — Ambulatory Visit (INDEPENDENT_AMBULATORY_CARE_PROVIDER_SITE_OTHER): Payer: Managed Care, Other (non HMO) | Admitting: Internal Medicine

## 2020-04-03 ENCOUNTER — Other Ambulatory Visit: Payer: Self-pay

## 2020-04-03 ENCOUNTER — Encounter: Payer: Self-pay | Admitting: Internal Medicine

## 2020-04-03 VITALS — BP 120/70 | HR 108 | Ht 64.0 in | Wt 173.0 lb

## 2020-04-03 DIAGNOSIS — E785 Hyperlipidemia, unspecified: Secondary | ICD-10-CM | POA: Diagnosis not present

## 2020-04-03 DIAGNOSIS — E119 Type 2 diabetes mellitus without complications: Secondary | ICD-10-CM | POA: Diagnosis not present

## 2020-04-03 DIAGNOSIS — E669 Obesity, unspecified: Secondary | ICD-10-CM

## 2020-04-03 LAB — POCT GLYCOSYLATED HEMOGLOBIN (HGB A1C): Hemoglobin A1C: 7.2 % — AB (ref 4.0–5.6)

## 2020-04-03 NOTE — Progress Notes (Signed)
Patient ID: Elizabeth Ware, female   DOB: 29-Dec-1960, 59 y.o.   MRN: [id number]  This visit occurred during the SARS-CoV-2 public health emergency.  Safety protocols were in place, including screening questions prior to the visit, additional usage of staff PPE, and extensive cleaning of exam room while observing appropriate contact time as indicated for disinfecting solutions.    HPI: Elizabeth Ware is a 59 y.o.-year-old female, presenting for f/u for DM2, prev GDM, dx in 05/2014, non-insulin-dependent, uncontrolled, without complications. Last visit 4 months ago.  Reviewed HbA1c levels: Lab Results  Component Value Date   HGBA1C 7.1 (A) 12/06/2019   HGBA1C 7.1 (A) 07/27/2019   HGBA1C 6.6 (A) 03/22/2019   Pt is on a regimen of: - Glyxambi (empagliflozin-linagliptin) 25-5 mg daily before breakfast At last visit, we stopped glipizide XL 5 mg in a.m. >> she is feeling much better. She had nausea with Metformin before.  She is checking CBGs "every once in a while": - am:   90s-120 >> 96, 98, 114-120 >> 90s-120 >> 114-120 - 2h after b'fast: n/c  - before lunch: n/c - 2h after lunch: 172-200 >> 160 >> n/c >> 160 >> n/c - before dinner: n/c - 2h after dinner: n/c - bedtime:   100-120s >> n/c >> 106-135 >> 130-160 >> 189  - nighttime: n/c Lowest sugar was  90 >> 114; it is unclear at which level she has hypoglycemia awareness. Highest sugar was 160 >> 189.  Glucometer: One Touch Verio  Pt's meals are: - Breakfast: 2 Eggos + fruit - Lunch: pita tuna; [employer]; yoghurt - Dinner: meat + veggies + spaghetti - Snacks: dill pickles, nuts, chocolate, potato chips, grapes She is on nutrition in the past and is helped.  She started exercising at the gym 10 years ago: Body pump, Zumba, but stopped due to the coronavirus pandemic.  However, she restarted at the end of last year and she continues to do this at [employer].  -No CKD, last BUN/creatinine:  Lab Results  Component Value Date   BUN 17  03/22/2019   CREATININE 0.92 03/22/2019  On olmesartan.  -+ HL; latest set of lipids: Lab Results  Component Value Date   CHOL 169 07/26/2019   HDL 42.90 07/26/2019   LDLCALC 93 07/26/2019   LDLDIRECT 122.0 04/01/2017   TRIG 166.0 (H) 07/26/2019   CHOLHDL 4 07/26/2019  We started Crestor 5-02/2019.  She takes this 1-2x a week due to mental fog and fatigue.  - last eye exam was in 12/2019: No DR.  Dr. Lawerance Bach in [facility] [facility].  - she denies numbness and tingling in her feet.  She has a h/o diverticulitis >> part of colon removed in 2008.  Her husband is competing in bowling tournaments.    ROS: Constitutional: no weight gain/no weight loss, no fatigue, no subjective hyperthermia, no subjective hypothermia Eyes: no blurry vision, no xerophthalmia ENT: no sore throat, no nodules palpated in neck, no dysphagia, no odynophagia, no hoarseness Cardiovascular: no CP/no SOB/no palpitations/no leg swelling Respiratory: no cough/no SOB/no wheezing Gastrointestinal: no N/no V/no D/no C/no acid reflux Musculoskeletal: no muscle aches/no joint aches Skin: no rashes, no hair loss Neurological: no tremors/no numbness/no tingling/no dizziness  I reviewed pt's medications, allergies, PMH, social hx, family hx, and changes were documented in the history of present illness. Otherwise, unchanged from my initial visit note.   Past Medical History:  Diagnosis Date  . Diabetes mellitus without complication (HCC)   . Diverticulitis   .  Hypertension    Past Surgical History:  Procedure Laterality Date  . COLON SURGERY     History   Social History  . Marital Status: Married    Spouse Name: N/A  . Number of Children: 1   Occupational History  .    Social History Main Topics  . Smoking status: Never Smoker   . Smokeless tobacco: Not on file  . Alcohol Use: No  . Drug Use: No   Current Outpatient Medications on File Prior to Visit  Medication Sig Dispense Refill  . clorazepate  (TRANXENE) 7.5 MG tablet TAKE 1 TABLET BY MOUTH TWICE A DAY AS NEEDED FOR ANXIETY/SLEEP 60 tablet 1  . fluticasone (FLONASE) 50 MCG/ACT nasal spray Place 2 sprays into both nostrils daily. 16 g 1  . GLYXAMBI 25-5 MG TABS TAKE 1 TABLET BY MOUTH EVERY DAY 90 tablet 3  . hydrochlorothiazide (HYDRODIURIL) 25 MG tablet TAKE 1 TABLET BY MOUTH EVERY DAY 90 tablet 3  . olmesartan (BENICAR) 40 MG tablet TAKE 1 TABLET BY MOUTH EVERY DAY 90 tablet 3  . ONETOUCH DELICA LANCETS FINE MISC 1 Device by Does not apply route daily as needed. 50 each 3  . ONETOUCH VERIO test strip USE TO CHECK BLOOD SUGAR THREE TIMES A DAY 300 strip 1  . oxymetazoline (AFRIN NASAL SPRAY) 0.05 % nasal spray Place 1 spray into both nostrils 2 (two) times daily. 30 mL 0  . pantoprazole (PROTONIX) 40 MG tablet     . rosuvastatin (CRESTOR) 5 MG tablet Take 1 tablet (5 mg total) by mouth daily. 90 tablet 3  . terbinafine (LAMISIL) 250 MG tablet Take 1 tablet (250 mg total) by mouth daily. 90 tablet 0   No current facility-administered medications on file prior to visit.   Allergies  Allergen Reactions  . Cetirizine Hcl     REACTION: fatigue  . Metformin And Related Nausea And Vomiting  . Penicillins Nausea And Vomiting  . Valsartan     REACTION: rash   Family History  Problem Relation Age of Onset  . Diabetes Mother   . Stroke Mother   . Cancer Father        colon or liver  Thyroid disease and hypertension in mother  PE: BP 120/70   Pulse (!) 108   Ht 5\' 4"  (1.626 m)   Wt 173 lb (78.5 kg)   SpO2 96%   BMI 29.70 kg/m  Body mass index is 29.7 kg/m.  Wt Readings from Last 3 Encounters:  04/03/20 173 lb (78.5 kg)  12/06/19 175 lb (79.4 kg)  07/26/19 175 lb (79.4 kg)   Constitutional: overweight, in NAD Eyes: PERRLA, EOMI, no exophthalmos ENT: moist mucous membranes, no thyromegaly, no cervical lymphadenopathy Cardiovascular: Tachycardia, RR, No MRG, + B mild nonpitting periankle edema Respiratory: CTA  B Gastrointestinal: abdomen soft, NT, ND, BS+ Musculoskeletal: no deformities, strength intact in all 4 Skin: moist, warm, no rashes Neurological: no tremor with outstretched hands, DTR normal in all 4  ASSESSMENT: 1. DM2, non-insulin-dependent, uncontrolled, without long term complications, but with hyperglycemia  2. HL  3. Overweight  PLAN:  1. Patient with history of controlled diabetes on oral antidiabetic regimen with DPP 4 inhibitor and instructed to him either, combined in the Glyxambi tablet.  In the past, she was also on glipizide XL, but we were able to stop this due to improved blood sugars after starting to go to the gym and exercising consistently and improving her diet.  She had less  mental fog and dizziness after stopping glipizide.  HbA1c increased from 6.6% to 7.1% and it was the same at last visit.  At that time, she was not checking sugars consistently and again I advised her to do so.  We did not change her regimen because whenever she checked her blood sugars, they were at goal. -At today's visit, she is still checking sugars sporadically and they were at goal whenever checked, except for one blood sugar at 189 obtained at that time.  However, this will be only blood sugar checked at that time so it is difficult to make changes in her regimen based on this. -I again discussed with her about the absolute importance of checking blood sugars every daily or at least every other day and rotating check times to identify blood sugar patterns.  I advised her to write her sugars down-given blood sugar log. -At today's visit I advised her to continue Glyxambi at the same dose -I suggested to:  Patient Instructions  Please continue: - Glyxambi 25-5 mg daily before b'fast.  Check sugars once a day, rotating check times.  Please return in 3-4 months with your sugar log.  - we checked her HbA1c: 7.2% (slightly higher) - advised to check sugars at different times of the day - 1x a  day, rotating check times - advised for yearly eye exams >> she is UTD - return to clinic in 4 months  2. HL -Reviewed latest lipid panel from 07/2019: Lipid fractions at goal, with only slightly high triglycerides: Lab Results  Component Value Date   CHOL 169 07/26/2019   HDL 42.90 07/26/2019   LDLCALC 93 07/26/2019   LDLDIRECT 122.0 04/01/2017   TRIG 166.0 (H) 07/26/2019   CHOLHDL 4 07/26/2019  -She is on Crestor 5 mg once or twice a week due to mental fog and fatigue from taking it daily.  3. Obesity class 1 -Continue SGLT2 inhibitor, which should also help with weight loss -Weight was stable at last visit, and lost 2 pounds since then   Carlus Pavlov, MD PhD [facility] Endocrinology

## 2020-04-03 NOTE — Patient Instructions (Addendum)
Please continue: - Glyxambi 25-5 mg daily before b'fast.  Check sugars once a day, rotating check times.  Please return in 3-4 months with your sugar log.

## 2020-05-02 ENCOUNTER — Ambulatory Visit: Payer: Managed Care, Other (non HMO)

## 2020-06-10 ENCOUNTER — Inpatient Hospital Stay: Admission: RE | Admit: 2020-06-10 | Payer: Managed Care, Other (non HMO) | Source: Ambulatory Visit

## 2020-07-28 ENCOUNTER — Other Ambulatory Visit: Payer: Self-pay

## 2020-07-28 ENCOUNTER — Ambulatory Visit
Admission: RE | Admit: 2020-07-28 | Discharge: 2020-07-28 | Disposition: A | Discharge status: Home / Self Care | Payer: Managed Care, Other (non HMO) | Source: Ambulatory Visit | Attending: Obstetrics and Gynecology | Admitting: Obstetrics and Gynecology

## 2020-07-28 DIAGNOSIS — Z1231 Encounter for screening mammogram for malignant neoplasm of breast: Secondary | ICD-10-CM

## 2020-08-07 ENCOUNTER — Ambulatory Visit (INDEPENDENT_AMBULATORY_CARE_PROVIDER_SITE_OTHER): Payer: Managed Care, Other (non HMO) | Admitting: Internal Medicine

## 2020-08-07 ENCOUNTER — Other Ambulatory Visit: Payer: Self-pay

## 2020-08-07 ENCOUNTER — Encounter: Payer: Self-pay | Admitting: Internal Medicine

## 2020-08-07 VITALS — BP 122/80 | HR 90 | Ht 64.0 in | Wt 171.6 lb

## 2020-08-07 DIAGNOSIS — E785 Hyperlipidemia, unspecified: Secondary | ICD-10-CM

## 2020-08-07 DIAGNOSIS — E669 Obesity, unspecified: Secondary | ICD-10-CM

## 2020-08-07 DIAGNOSIS — E119 Type 2 diabetes mellitus without complications: Secondary | ICD-10-CM

## 2020-08-07 LAB — POCT GLYCOSYLATED HEMOGLOBIN (HGB A1C): Hemoglobin A1C: 7.1 % — AB (ref 4.0–5.6)

## 2020-08-07 MED ORDER — GLYXAMBI 25-5 MG PO TABS
1.0000 | ORAL_TABLET | Freq: Every day | ORAL | 3 refills | Status: DC
Start: 2020-08-07 — End: 2021-08-11

## 2020-08-07 NOTE — Progress Notes (Signed)
Patient ID: CHASLYN EISEN, female   DOB: 1960-12-04, 60 y.o.   MRN: [id number]  This visit occurred during the SARS-CoV-2 public health emergency.  Safety protocols were in place, including screening questions prior to the visit, additional usage of staff PPE, and extensive cleaning of exam room while observing appropriate contact time as indicated for disinfecting solutions.    HPI: MAHATI VAJDA is a 60 y.o.-year-old female, presenting for f/u for DM2, prev GDM, dx in 05/2014, non-insulin-dependent, uncontrolled, without complications. Last visit 4 months ago.  Interim history: She goes to the gym 4x a week: dance and occasional weights. She feels well, without complaints at this visit: No weight changes, nausea, blurry vision, increased urination. She is due for another appointment with PCP, which she plans to schedule.  Reviewed HbA1c levels: Lab Results  Component Value Date   HGBA1C 7.2 (A) 04/03/2020   HGBA1C 7.1 (A) 12/06/2019   HGBA1C 7.1 (A) 07/27/2019   HGBA1C 6.6 (A) 03/22/2019   HGBA1C 6.4 (A) 05/05/2018   HGBA1C 6.4 (A) 10/28/2017   HGBA1C 6.0 04/01/2017   HGBA1C 6.2 10/01/2016   HGBA1C 6.2 09/19/2015   HGBA1C 6.1 06/23/2015   HGBA1C 6.3 02/18/2015   HGBA1C 8.3 (H) 09/05/2014   HGBA1C 11.8 (H) 06/19/2014   Pt is on a regimen of: - Glyxambi (empagliflozin-linagliptin) 25-5 mg daily before breakfast At last visit, we stopped glipizide XL 5 mg in a.m. >> she is feeling much better. She had nausea with Metformin before.  She is checking CBGs 0 to once a day-again, no log, no meter: - am:  96, 98, 114-120 >> 90s-120 >> 114-120 >> 120-149, ave 120s - 2h after b'fast: n/c  - before lunch: n/c - 2h after lunch: 172-200 >> 160 >> n/c >> 160 >> n/c >> 109 - before dinner: n/c - 2h after dinner: n/c - bedtime:  106-135 >> 130-160 >> 189 >> 118, 120s, 200 - nighttime: n/c Lowest sugar was  90 >> 114 >> 109; it is unclear at which level she has hypoglycemia  awareness. Highest sugar was 160 >> 189 >> 200.  Glucometer: One Touch Verio  Pt's meals are: - Breakfast: 2 Eggos + fruit - Lunch: pita tuna; [employer]; yoghurt - Dinner: meat + veggies + spaghetti - Snacks: dill pickles, nuts, chocolate, potato chips, grapes She is on nutrition in the past and is helped.  She started exercising at the gym in 1998, when [employer] opened: Body pump, Zumba, but stopped due to the coronavirus pandemic.  She restarted since last visit.  -No CKD, last BUN/creatinine:  Lab Results  Component Value Date   BUN 17 03/22/2019   CREATININE 0.92 03/22/2019  On olmesartan.  -+ HL; latest set of lipids: Lab Results  Component Value Date   CHOL 169 07/26/2019   HDL 42.90 07/26/2019   LDLCALC 93 07/26/2019   LDLDIRECT 122.0 04/01/2017   TRIG 166.0 (H) 07/26/2019   CHOLHDL 4 07/26/2019  We started Crestor 5-02/2019.  She takes this most days of the week, but not quite every day due to mental fog and fatigue.  - last eye exam was in 12/2019: No DR.  Dr. Lawerance Bach in [place] [facility].  - she denies numbness and tingling in her feet.  She has a h/o diverticulitis >> part of colon removed in 2008.  Her husband is competing in bowling tournaments.    ROS: Constitutional: no weight gain/no weight loss, no fatigue, no subjective hyperthermia, no subjective hypothermia Eyes: no blurry  vision, no xerophthalmia ENT: no sore throat, no nodules palpated in neck, no dysphagia, no odynophagia, no hoarseness Cardiovascular: no CP/no SOB/no palpitations/no leg swelling Respiratory: no cough/no SOB/no wheezing Gastrointestinal: no N/no V/no D/no C/no acid reflux Musculoskeletal: no muscle aches/no joint aches Skin: no rashes, no hair loss Neurological: no tremors/no numbness/no tingling/no dizziness  I reviewed pt's medications, allergies, PMH, social hx, family hx, and changes were documented in the history of present illness. Otherwise, unchanged from my initial visit  note.   Past Medical History:  Diagnosis Date  . Diabetes mellitus without complication (HCC)   . Diverticulitis   . Hypertension    Past Surgical History:  Procedure Laterality Date  . COLON SURGERY     History   Social History  . Marital Status: Married    Spouse Name: N/A  . Number of Children: 1   Occupational History  .    Social History Main Topics  . Smoking status: Never Smoker   . Smokeless tobacco: Not on file  . Alcohol Use: No  . Drug Use: No   Current Outpatient Medications on File Prior to Visit  Medication Sig Dispense Refill  . clorazepate (TRANXENE) 7.5 MG tablet TAKE 1 TABLET BY MOUTH TWICE A DAY AS NEEDED FOR ANXIETY/SLEEP 60 tablet 1  . fluticasone (FLONASE) 50 MCG/ACT nasal spray Place 2 sprays into both nostrils daily. 16 g 1  . GLYXAMBI 25-5 MG TABS TAKE 1 TABLET BY MOUTH EVERY DAY 90 tablet 3  . hydrochlorothiazide (HYDRODIURIL) 25 MG tablet TAKE 1 TABLET BY MOUTH EVERY DAY 90 tablet 3  . olmesartan (BENICAR) 40 MG tablet TAKE 1 TABLET BY MOUTH EVERY DAY 90 tablet 3  . ONETOUCH DELICA LANCETS FINE MISC 1 Device by Does not apply route daily as needed. 50 each 3  . ONETOUCH VERIO test strip USE TO CHECK BLOOD SUGAR THREE TIMES A DAY 300 strip 1  . oxymetazoline (AFRIN NASAL SPRAY) 0.05 % nasal spray Place 1 spray into both nostrils 2 (two) times daily. 30 mL 0  . pantoprazole (PROTONIX) 40 MG tablet     . rosuvastatin (CRESTOR) 5 MG tablet Take 1 tablet (5 mg total) by mouth daily. 90 tablet 3  . terbinafine (LAMISIL) 250 MG tablet Take 1 tablet (250 mg total) by mouth daily. 90 tablet 0   No current facility-administered medications on file prior to visit.   Allergies  Allergen Reactions  . Cetirizine Hcl     REACTION: fatigue  . Metformin And Related Nausea And Vomiting  . Penicillins Nausea And Vomiting  . Valsartan     REACTION: rash   Family History  Problem Relation Age of Onset  . Diabetes Mother   . Stroke Mother   . Cancer  Father        colon or liver  Thyroid disease and hypertension in mother  PE: BP 122/80 (BP Location: Right Arm, Patient Position: Sitting, Cuff Size: Normal)   Pulse 90   Ht 5\' 4"  (1.626 m)   Wt 171 lb 9.6 oz (77.8 kg)   SpO2 98%   BMI 29.46 kg/m  Body mass index is 29.46 kg/m.  Wt Readings from Last 3 Encounters:  08/07/20 171 lb 9.6 oz (77.8 kg)  04/03/20 173 lb (78.5 kg)  12/06/19 175 lb (79.4 kg)   Constitutional: overweight, in NAD Eyes: PERRLA, EOMI, no exophthalmos ENT: moist mucous membranes, no thyromegaly, no cervical lymphadenopathy Cardiovascular: RRR, No MRG, no leg swelling Respiratory: CTA B Gastrointestinal: abdomen  soft, NT, ND, BS+ Musculoskeletal: no deformities, strength intact in all 4 Skin: moist, warm, no rashes Neurological: no tremor with outstretched hands, DTR normal in all 4  Diabetic Foot Exam - Simple   Simple Foot Form Diabetic Foot exam was performed with the following findings: Yes 08/07/2020  3:25 PM  Visual Inspection See comments: Yes Sensation Testing Intact to touch and monofilament testing bilaterally: Yes Pulse Check Posterior Tibialis and Dorsalis pulse intact bilaterally: Yes Comments + L>R hallux onychodystrophy    ASSESSMENT: 1. DM2, non-insulin-dependent, uncontrolled, without long term complications, but with hyperglycemia  2. HL  3. Overweight  PLAN:  1. Patient with History of controlled diabetes, on oral antidiabetic regimen with DPP 4 inhibitor and SGLT2 inhibitor.  In the past, she was also on glipizide XL but we were able to stop this due to the improved blood sugars.  She had less mental fog and dizziness after stopping glipizide.  HbA1c increased afterwards and at last visit this was slightly higher, at 7.2%.  At last visit, she was checking sugars sporadically they were at goal whenever she checked, except for one high blood sugar at 189 obtained at that time.  I advised her to check consistently, at different  times of the day but otherwise we did not change her regimen. -At today's visit, she still does not check sugars consistently or bring a log or meter.  She is slightly evasive regarding her blood sugars.  I again advised her about the importance of checking every day, rotating check times and brinking the records to our appointments.  I advised her to at least keep a log for the 2 weeks prior to our next appointment so we can review this together.  She agrees to do so. -I suggested to:  Patient Instructions  Please continue: - Glyxambi 25-5 mg daily before b'fast.  Check sugars once a day, rotating check times.  Please return in 3-4 months with your sugar log.  - we checked her HbA1c: 7.1% (slightly lower) - advised to check sugars at different times of the day - 1x a day, rotating check times - advised for yearly eye exams >> she is UTD - foot exam performed today - return to clinic in 3-4 months  2. HL -Reviewed latest lipid panel from 07/2019: Lipid fractions at goal, with only slightly high triglycerides: Lab Results  Component Value Date   CHOL 169 07/26/2019   HDL 42.90 07/26/2019   LDLCALC 93 07/26/2019   LDLDIRECT 122.0 04/01/2017   TRIG 166.0 (H) 07/26/2019   CHOLHDL 4 07/26/2019  -She is on Crestor 5 mg approximately 5 times a week.  Previously had mental fog and fatigue from taking it daily. -She is due for another lipid panel but she plans to schedule another appointment with PCP soon  3. Obesity class 1 -Continue the SGLT2 inhibitor which should also help with weight loss -She lost 2 pounds before last visit -Lost 2 pounds afterwards.  Carlus Pavlov, MD PhD [facility] Endocrinology

## 2020-08-07 NOTE — Patient Instructions (Addendum)
Please continue: - Glyxambi 25-5 mg daily before b'fast.  Check sugars once a day, rotating check times.  Please bring your meter or log.  Please return in 4 months with your sugar log.

## 2020-08-18 ENCOUNTER — Other Ambulatory Visit: Payer: Self-pay

## 2020-08-18 ENCOUNTER — Encounter: Payer: Self-pay | Admitting: Podiatry

## 2020-08-18 ENCOUNTER — Ambulatory Visit (INDEPENDENT_AMBULATORY_CARE_PROVIDER_SITE_OTHER): Payer: Managed Care, Other (non HMO) | Admitting: Podiatry

## 2020-08-18 DIAGNOSIS — B351 Tinea unguium: Secondary | ICD-10-CM | POA: Diagnosis not present

## 2020-08-18 DIAGNOSIS — M722 Plantar fascial fibromatosis: Secondary | ICD-10-CM | POA: Diagnosis not present

## 2020-08-20 NOTE — Progress Notes (Signed)
Subjective:   Patient ID: Elizabeth Ware, female   DOB: 60 y.o.   MRN: [id number]   HPI Patient is concerned about discoloration of the left big toe patient states that it has been this way but it seems like recently is gotten thicker again and she had taken an antifungal a number years ago which improved quite a few of her nails but this 1 has still been a problem.  Patient does not smoke likes to be active    ROS      Objective:  Physical Exam  Neurovascular status was found to be intact with patient found to have a dystrophic left hallux nail that is moderately thickened and appears to be traumatized with her other nails.  Healthy at the current condition.  Patient has good digital perfusion well oriented     Assessment:  Most likely this is more of a traumatic nail event versus a fungal nail event with possibility for secondary fungal element      Plan:  H&P reviewed condition and at this point we discussed different treatment options including further oral antifungal therapy laser therapy topical and nail removal.  Since it does not hurt and I already have had her on 90 days of oral treatment I have recommended that she just take a wait-and-see approach with this and that unfortunately it is probably trauma as part of the precipitating factor but since it does not hurt I do not feel comfortable removing it even though I educated her on that today.  She is encouraged to call with questions concerns which may arise

## 2020-08-24 ENCOUNTER — Other Ambulatory Visit: Payer: Self-pay | Admitting: Internal Medicine

## 2020-08-27 ENCOUNTER — Encounter: Payer: Managed Care, Other (non HMO) | Admitting: Internal Medicine

## 2020-09-21 ENCOUNTER — Other Ambulatory Visit: Payer: Self-pay | Admitting: Internal Medicine

## 2020-10-02 ENCOUNTER — Telehealth (INDEPENDENT_AMBULATORY_CARE_PROVIDER_SITE_OTHER): Payer: Managed Care, Other (non HMO) | Admitting: Internal Medicine

## 2020-10-02 ENCOUNTER — Encounter: Payer: Self-pay | Admitting: Internal Medicine

## 2020-10-02 DIAGNOSIS — J310 Chronic rhinitis: Secondary | ICD-10-CM

## 2020-10-02 DIAGNOSIS — J019 Acute sinusitis, unspecified: Secondary | ICD-10-CM

## 2020-10-02 MED ORDER — METHYLPREDNISOLONE 4 MG PO TBPK: ORAL_TABLET | ORAL | 0 refills | Status: DC

## 2020-10-02 MED ORDER — AZITHROMYCIN 250 MG PO TABS: ORAL_TABLET | ORAL | 0 refills | Status: DC

## 2020-10-02 MED ORDER — FLUCONAZOLE 150 MG PO TABS: 150.0000 mg | ORAL_TABLET | Freq: Once | ORAL | 1 refills | Status: AC

## 2020-10-02 NOTE — Assessment & Plan Note (Signed)
Worse Medrol pack Claritin po

## 2020-10-02 NOTE — Progress Notes (Signed)
Virtual Visit via Telephone Note  I connected with Elizabeth Ware on 10/02/20 at  2:00 PM EDT by telephone and verified that I am speaking with the correct person using two identifiers.  Location: Patient: Home Provider: [facility]   I discussed the limitations, risks, security and privacy concerns of performing an evaluation and management service by telephone and the availability of in person appointments. I also discussed with the patient that there may be a patient responsible charge related to this service. The patient expressed understanding and agreed to proceed.   History of Present Illness:   The patient is complaining of severe allergies, nasal congestion, sinus pain, green nasal discharge of several days duration, pain in the eyes.  She had a similar problem last spring. Observations/Objective: She sounds normal on the phone  Assessment and Plan: See plan  Follow Up Instructions:    I discussed the assessment and treatment plan with the patient. The patient was provided an opportunity to ask questions and all were answered. The patient agreed with the plan and demonstrated an understanding of the instructions.   The patient was advised to call back or seek an in-person evaluation if the symptoms worsen or if the condition fails to improve as anticipated.  I provided 14 minutes of non-face-to-face time during this encounter.   Sonda Primes, MD

## 2020-10-02 NOTE — Assessment & Plan Note (Signed)
Zpac 

## 2020-10-19 ENCOUNTER — Other Ambulatory Visit: Payer: Self-pay | Admitting: Internal Medicine

## 2020-10-24 ENCOUNTER — Other Ambulatory Visit: Payer: Self-pay | Admitting: Internal Medicine

## 2020-11-05 ENCOUNTER — Encounter: Payer: Self-pay | Admitting: Internal Medicine

## 2020-11-05 ENCOUNTER — Other Ambulatory Visit: Payer: Self-pay

## 2020-11-05 ENCOUNTER — Ambulatory Visit (INDEPENDENT_AMBULATORY_CARE_PROVIDER_SITE_OTHER): Payer: Managed Care, Other (non HMO) | Admitting: Internal Medicine

## 2020-11-05 VITALS — BP 104/78 | HR 96 | Temp 98.4°F | Ht 64.0 in | Wt 168.0 lb

## 2020-11-05 DIAGNOSIS — E119 Type 2 diabetes mellitus without complications: Secondary | ICD-10-CM

## 2020-11-05 DIAGNOSIS — Z0000 Encounter for general adult medical examination without abnormal findings: Secondary | ICD-10-CM

## 2020-11-05 NOTE — Progress Notes (Signed)
Subjective:  Patient ID: Elizabeth Ware, female    DOB: 14-Apr-1961  Age: 60 y.o. MRN: [id number]  CC: Annual Exam   HPI Elizabeth Ware presents for a well exam  Outpatient Medications Prior to Visit  Medication Sig Dispense Refill   clorazepate (TRANXENE) 7.5 MG tablet TAKE 1 TABLET BY MOUTH TWICE A DAY AS NEEDED FOR ANXIETY/SLEEP 60 tablet 1   Empagliflozin-linaGLIPtin (GLYXAMBI) 25-5 MG TABS Take 1 tablet by mouth daily. 90 tablet 3   fluticasone (FLONASE) 50 MCG/ACT nasal spray Place 2 sprays into both nostrils daily. 16 g 1   hydrochlorothiazide (HYDRODIURIL) 25 MG tablet TAKE 1 TABLET BY MOUTH EVERY DAY 90 tablet 1   olmesartan (BENICAR) 40 MG tablet TAKE 1 TABLET BY MOUTH EVERY DAY 90 tablet 3   ONETOUCH DELICA LANCETS FINE MISC 1 Device by Does not apply route daily as needed. 50 each 3   ONETOUCH VERIO test strip USE TO CHECK BLOOD SUGAR THREE TIMES A DAY 300 strip 1   pantoprazole (PROTONIX) 40 MG tablet      rosuvastatin (CRESTOR) 5 MG tablet TAKE 1 TABLET BY MOUTH EVERY DAY 90 tablet 3   azithromycin (ZITHROMAX Z-PAK) 250 MG tablet As directed (Patient not taking: Reported on 11/05/2020) 6 tablet 0   methylPREDNISolone (MEDROL DOSEPAK) 4 MG TBPK tablet As directed (Patient not taking: Reported on 11/05/2020) 21 tablet 0   oxymetazoline (AFRIN NASAL SPRAY) 0.05 % nasal spray Place 1 spray into both nostrils 2 (two) times daily. (Patient not taking: Reported on 11/05/2020) 30 mL 0   terbinafine (LAMISIL) 250 MG tablet Take 1 tablet (250 mg total) by mouth daily. (Patient not taking: Reported on 11/05/2020) 90 tablet 0   No facility-administered medications prior to visit.    ROS: Review of Systems  Constitutional:  Negative for activity change, appetite change, chills, fatigue and unexpected weight change.  HENT:  Negative for congestion, mouth sores and sinus pressure.   Eyes:  Negative for visual disturbance.  Respiratory:  Negative for cough and chest tightness.    Gastrointestinal:  Negative for abdominal pain and nausea.  Genitourinary:  Negative for difficulty urinating, frequency and vaginal pain.  Musculoskeletal:  Negative for back pain and gait problem.  Skin:  Negative for pallor and rash.  Neurological:  Negative for dizziness, tremors, weakness, numbness and headaches.  Psychiatric/Behavioral:  Negative for confusion and sleep disturbance.    Objective:  BP 104/78 (BP Location: Left Arm)   Pulse 96   Temp 98.4 F (36.9 C) (Oral)   Ht 5\' 4"  (1.626 m)   Wt 168 lb (76.2 kg)   SpO2 95%   BMI 28.84 kg/m   BP Readings from Last 3 Encounters:  11/05/20 104/78  08/07/20 122/80  04/03/20 120/70    Wt Readings from Last 3 Encounters:  11/05/20 168 lb (76.2 kg)  08/07/20 171 lb 9.6 oz (77.8 kg)  04/03/20 173 lb (78.5 kg)    Physical Exam Constitutional:      General: She is not in acute distress.    Appearance: She is well-developed. She is obese.  HENT:     Head: Normocephalic.     Right Ear: External ear normal.     Left Ear: External ear normal.     Nose: Nose normal.  Eyes:     General:        Right eye: No discharge.        Left eye: No discharge.     Conjunctiva/sclera: Conjunctivae  normal.     Pupils: Pupils are equal, round, and reactive to light.  Neck:     Thyroid: No thyromegaly.     Vascular: No JVD.     Trachea: No tracheal deviation.  Cardiovascular:     Rate and Rhythm: Normal rate and regular rhythm.     Heart sounds: Normal heart sounds.  Pulmonary:     Effort: No respiratory distress.     Breath sounds: No stridor. No wheezing.  Abdominal:     General: Bowel sounds are normal. There is no distension.     Palpations: Abdomen is soft. There is no mass.     Tenderness: There is no abdominal tenderness. There is no guarding or rebound.  Musculoskeletal:        General: No tenderness.     Cervical back: Normal range of motion and neck supple. No rigidity.  Lymphadenopathy:     Cervical: No cervical  adenopathy.  Skin:    Findings: No erythema or rash.  Neurological:     Cranial Nerves: No cranial nerve deficit.     Motor: No abnormal muscle tone.     Coordination: Coordination normal.     Deep Tendon Reflexes: Reflexes normal.  Psychiatric:        Behavior: Behavior normal.        Thought Content: Thought content normal.        Judgment: Judgment normal.    Lab Results  Component Value Date   WBC 7.3 09/19/2015   HGB 12.8 09/19/2015   HCT 37.2 09/19/2015   PLT 324.0 09/19/2015   GLUCOSE 128 (H) 03/22/2019   CHOL 169 07/26/2019   TRIG 166.0 (H) 07/26/2019   HDL 42.90 07/26/2019   LDLDIRECT 122.0 04/01/2017   LDLCALC 93 07/26/2019   ALT 14 03/22/2019   AST 16 03/22/2019   NA 141 03/22/2019   K 3.5 03/22/2019   CL 101 03/22/2019   CREATININE 0.92 03/22/2019   BUN 17 03/22/2019   CO2 31 03/22/2019   TSH 1.42 09/19/2015   INR 0.9 04/11/2007   HGBA1C 7.1 (A) 08/07/2020   MICROALBUR <0.7 03/22/2019    MM 3D SCREEN BREAST BILATERAL  Result Date: 07/31/2020 CLINICAL DATA:  Screening. EXAM: DIGITAL SCREENING BILATERAL MAMMOGRAM WITH TOMOSYNTHESIS AND CAD TECHNIQUE: Bilateral screening digital craniocaudal and mediolateral oblique mammograms were obtained. Bilateral screening digital breast tomosynthesis was performed. The images were evaluated with computer-aided detection. COMPARISON:  Previous exam(s). ACR Breast Density Category b: There are scattered areas of fibroglandular density. FINDINGS: There are no findings suspicious for malignancy. The images were evaluated with computer-aided detection. IMPRESSION: No mammographic evidence of malignancy. A result letter of this screening mammogram will be mailed directly to the patient. RECOMMENDATION: Screening mammogram in one year. (Code:SM-B-01Y) BI-RADS CATEGORY  1: Negative. Electronically Signed   By: Bary Richard M.D.   On: 07/31/2020 12:12    Assessment & Plan:     Follow-up: No follow-ups on file.  Sonda Primes, MD

## 2020-11-05 NOTE — Assessment & Plan Note (Signed)
A cardiac CT scan for coronary calcium offered again

## 2020-11-05 NOTE — Patient Instructions (Signed)

## 2020-12-11 ENCOUNTER — Ambulatory Visit (INDEPENDENT_AMBULATORY_CARE_PROVIDER_SITE_OTHER): Payer: Managed Care, Other (non HMO) | Admitting: Internal Medicine

## 2020-12-11 ENCOUNTER — Encounter: Payer: Self-pay | Admitting: Internal Medicine

## 2020-12-11 ENCOUNTER — Other Ambulatory Visit: Payer: Self-pay

## 2020-12-11 VITALS — BP 110/72 | HR 92 | Ht 64.0 in | Wt 170.8 lb

## 2020-12-11 DIAGNOSIS — E66811 Obesity, class 1: Secondary | ICD-10-CM

## 2020-12-11 DIAGNOSIS — E119 Type 2 diabetes mellitus without complications: Secondary | ICD-10-CM

## 2020-12-11 DIAGNOSIS — E669 Obesity, unspecified: Secondary | ICD-10-CM | POA: Diagnosis not present

## 2020-12-11 DIAGNOSIS — E785 Hyperlipidemia, unspecified: Secondary | ICD-10-CM

## 2020-12-11 LAB — POCT GLYCOSYLATED HEMOGLOBIN (HGB A1C): Hemoglobin A1C: 7 % — AB (ref 4.0–5.6)

## 2020-12-11 NOTE — Addendum Note (Signed)
Addended by: Kenyon Ana on: 12/11/2020 05:05 PM   Modules accepted: Orders

## 2020-12-11 NOTE — Patient Instructions (Addendum)
Please continue: - Glyxambi 25-5 mg daily before b'fast.  Check sugars once a day, rotating check times.  Call Dr. Loren Racer office and schedule the labs.  Please return in 4 months with your sugar log.

## 2020-12-11 NOTE — Progress Notes (Signed)
Patient ID: Elizabeth Ware, female   DOB: Feb 09, 1961, 60 y.o.   MRN: [id number]  This visit occurred during the SARS-CoV-2 public health emergency.  Safety protocols were in place, including screening questions prior to the visit, additional usage of staff PPE, and extensive cleaning of exam room while observing appropriate contact time as indicated for disinfecting solutions.    HPI: Elizabeth Ware is a 60 y.o.-year-old female, presenting for f/u for DM2, prev GDM, dx in 05/2014, non-insulin-dependent, uncontrolled, without complications. Last visit 4 months ago.  Interim history: She continues to go to the gym consistently. No increased urination, blurry vision, nausea, chest pain.  Reviewed HbA1c levels: Lab Results  Component Value Date   HGBA1C 7.1 (A) 08/07/2020   HGBA1C 7.2 (A) 04/03/2020   HGBA1C 7.1 (A) 12/06/2019   HGBA1C 7.1 (A) 07/27/2019   HGBA1C 6.6 (A) 03/22/2019   HGBA1C 6.4 (A) 05/05/2018   HGBA1C 6.4 (A) 10/28/2017   HGBA1C 6.0 04/01/2017   HGBA1C 6.2 10/01/2016   HGBA1C 6.2 09/19/2015   HGBA1C 6.1 06/23/2015   HGBA1C 6.3 02/18/2015   HGBA1C 8.3 (H) 09/05/2014   HGBA1C 11.8 (H) 06/19/2014   Pt is on a regimen of: - Glyxambi (empagliflozin-linagliptin) 25-5 mg daily before breakfast At last visit, we stopped glipizide XL 5 mg in a.m. >> she is feeling much better. She had nausea with Metformin before.  She is checking CBGs 0 to once a day: - am:  90s-120 >> 114-120 >> 120-149, ave 120s >> 108-233 - 2h after b'fast: n/c  - before lunch: n/c - 2h after lunch: 160 >> n/c >> 160 >> n/c >> 109 >> n/c - before dinner: n/c - 2h after dinner: n/c - bedtime: 130-160 >> 189 >> 118, 120s, 200 >> n/c - nighttime: n/c Lowest sugar was  90 >> 114 >> 109 >> 108; it is unclear at which level she has hypoglycemia awareness. Highest sugar was 160 >> 189 >> 200 >> 233.  Glucometer: One Touch Verio  Pt's meals are: - Breakfast: 2 Eggos + fruit - Lunch: pita tuna; [employer];  yoghurt - Dinner: meat + veggies + spaghetti - Snacks: dill pickles, nuts, chocolate, potato chips, grapes She is on nutrition in the past and is helped.  She started exercising at the gym in 1998, when [employer] opened: Body pump, Zumba, but stopped due to the coronavirus pandemic.  She restarted afterwards.  -No CKD, last BUN/creatinine:  Lab Results  Component Value Date   BUN 17 03/22/2019   CREATININE 0.92 03/22/2019  On olmesartan.  -+ HL; latest set of lipids: Lab Results  Component Value Date   CHOL 169 07/26/2019   HDL 42.90 07/26/2019   LDLCALC 93 07/26/2019   LDLDIRECT 122.0 04/01/2017   TRIG 166.0 (H) 07/26/2019   CHOLHDL 4 07/26/2019  We started Crestor 5 in 02/2019.  She takes this most days of the week, but not quite every day due to mental fog and fatigue.  - last eye exam was in 12/2019: No DR.  Dr. Lawerance Bach in [facility].  - she denies numbness and tingling in her feet.  She has a h/o diverticulitis >> part of colon removed in 2008.  Her husband is competing in bowling tournaments.    ROS: Constitutional: no weight gain/no weight loss, no fatigue, no subjective hyperthermia, no subjective hypothermia Eyes: no blurry vision, no xerophthalmia ENT: no sore throat, no nodules palpated in neck, no dysphagia, no odynophagia, no hoarseness Cardiovascular: no CP/no SOB/no  palpitations/no leg swelling Respiratory: no cough/no SOB/no wheezing Gastrointestinal: no N/no V/no D/no C/no acid reflux Musculoskeletal: no muscle aches/no joint aches Skin: no rashes, no hair loss Neurological: no tremors/no numbness/no tingling/no dizziness  I reviewed pt's medications, allergies, PMH, social hx, family hx, and changes were documented in the history of present illness. Otherwise, unchanged from my initial visit note.   Past Medical History:  Diagnosis Date   Diabetes mellitus without complication (HCC)    Diverticulitis    Hypertension    Past Surgical History:   Procedure Laterality Date   COLON SURGERY     History   Social History   Marital Status: Married    Spouse Name: N/A   Number of Children: 1   Occupational History      Social History Main Topics   Smoking status: Never Smoker    Smokeless tobacco: Not on file   Alcohol Use: No   Drug Use: No   Current Outpatient Medications on File Prior to Visit  Medication Sig Dispense Refill   clorazepate (TRANXENE) 7.5 MG tablet TAKE 1 TABLET BY MOUTH TWICE A DAY AS NEEDED FOR ANXIETY/SLEEP 60 tablet 1   Empagliflozin-linaGLIPtin (GLYXAMBI) 25-5 MG TABS Take 1 tablet by mouth daily. 90 tablet 3   fluticasone (FLONASE) 50 MCG/ACT nasal spray Place 2 sprays into both nostrils daily. 16 g 1   hydrochlorothiazide (HYDRODIURIL) 25 MG tablet TAKE 1 TABLET BY MOUTH EVERY DAY 90 tablet 1   olmesartan (BENICAR) 40 MG tablet TAKE 1 TABLET BY MOUTH EVERY DAY 90 tablet 3   ONETOUCH DELICA LANCETS FINE MISC 1 Device by Does not apply route daily as needed. 50 each 3   ONETOUCH VERIO test strip USE TO CHECK BLOOD SUGAR THREE TIMES A DAY 300 strip 1   pantoprazole (PROTONIX) 40 MG tablet      rosuvastatin (CRESTOR) 5 MG tablet TAKE 1 TABLET BY MOUTH EVERY DAY 90 tablet 3   No current facility-administered medications on file prior to visit.   Allergies  Allergen Reactions   Cetirizine Hcl     REACTION: fatigue   Metformin And Related Nausea And Vomiting   Penicillins Nausea And Vomiting   Valsartan     REACTION: rash   Family History  Problem Relation Age of Onset   Diabetes Mother    Stroke Mother    Cancer Father        colon or liver  Thyroid disease and hypertension in mother  PE: BP 110/72 (BP Location: Right Arm, Patient Position: Sitting, Cuff Size: Normal)   Pulse 92   Ht 5\' 4"  (1.626 m)   Wt 170 lb 12.8 oz (77.5 kg)   SpO2 97%   BMI 29.32 kg/m  Body mass index is 29.32 kg/m.  Wt Readings from Last 3 Encounters:  12/11/20 170 lb 12.8 oz (77.5 kg)  11/05/20 168 lb (76.2  kg)  08/07/20 171 lb 9.6 oz (77.8 kg)   Constitutional: overweight, in NAD Eyes: PERRLA, EOMI, no exophthalmos ENT: moist mucous membranes, no thyromegaly, no cervical lymphadenopathy Cardiovascular: RRR, No MRG, no leg swelling Respiratory: CTA B Gastrointestinal: abdomen soft, NT, ND, BS+ Musculoskeletal: no deformities, strength intact in all 4 Skin: moist, warm, no rashes Neurological: no tremor with outstretched hands, DTR normal in all 4  Diabetic Foot Exam - Simple   No data filed    ASSESSMENT: 1. DM2, non-insulin-dependent, uncontrolled, without long term complications, but with hyperglycemia  2. HL  3. Overweight  PLAN:  1.  Patient with history of uncontrolled diabetes, on oral antidiabetic regimen with DPP 4 inhibitor and SGLT2 inhibitor.  In the past, she was also on glipizide XL but we were able to stop this due to improved blood sugars.  She had less mental fog and dizziness after stopping glipizide.  However, HbA1c increased afterwards to 7.2%.  At last visit, she did not check sugars consistently or bring a log or meter.  She was evasive regarding her blood sugars.  I discussed with her about the importance of checking sugars every day, rotating check times and bring in the records to our appointment.  I advised her to at least keep a log for the 2 weeks prior to our next appointment so we could review it together.  HbA1c was 7.1% at last visit, slightly improved. -At today's visit, she brings several blood sugar checks and they are quite variable.  Highest: 233 which she had several eating sweets the night before.  However, there are not enough data points as she only checks in the morning so I am unable to detect patterns. We checked her HbA1c: 7.0% (lower).  Therefore, for now, I advised her to continue the current regimen but I gave her blood sugar logs to be able to start checking consistently.  -I suggested to:  Patient Instructions  Please continue: - Glyxambi  25-5 mg daily before b'fast.  Check sugars once a day, rotating check times.  Please return in 3-4 months with your sugar log.  - advised to check sugars at different times of the day - 1x a day, rotating check times - advised for yearly eye exams >> she is UTD - return to clinic in 3-4 months  2. HL -07/2019: Lipid fractions at goal with exception of a slightly high triglyceride levels: Lab Results  Component Value Date   CHOL 169 07/26/2019   HDL 42.90 07/26/2019   LDLCALC 93 07/26/2019   LDLDIRECT 122.0 04/01/2017   TRIG 166.0 (H) 07/26/2019   CHOLHDL 4 07/26/2019  -She takes Crestor 5 mg 5 times a week.  She had mental fog and fatigue from taking it daily. -She has labs ordered by PCP and I advised her to call and schedule them  3. Obesity class 1 -Continues SGLT2 inhibitor which should also help with weight loss -She lost 2 pounds before last visit and 1 pound since then  Carlus Pavlov, MD PhD [facility]

## 2021-03-09 ENCOUNTER — Other Ambulatory Visit: Payer: Self-pay | Admitting: Internal Medicine

## 2021-04-13 ENCOUNTER — Other Ambulatory Visit: Payer: Self-pay

## 2021-04-13 ENCOUNTER — Ambulatory Visit (INDEPENDENT_AMBULATORY_CARE_PROVIDER_SITE_OTHER): Payer: Managed Care, Other (non HMO) | Admitting: Internal Medicine

## 2021-04-13 ENCOUNTER — Encounter: Payer: Self-pay | Admitting: Internal Medicine

## 2021-04-13 VITALS — BP 124/76 | HR 90 | Ht 64.0 in | Wt 173.8 lb

## 2021-04-13 DIAGNOSIS — E785 Hyperlipidemia, unspecified: Secondary | ICD-10-CM

## 2021-04-13 DIAGNOSIS — E669 Obesity, unspecified: Secondary | ICD-10-CM | POA: Diagnosis not present

## 2021-04-13 DIAGNOSIS — E119 Type 2 diabetes mellitus without complications: Secondary | ICD-10-CM | POA: Diagnosis not present

## 2021-04-13 LAB — POCT GLYCOSYLATED HEMOGLOBIN (HGB A1C): Hemoglobin A1C: 7.3 % — AB (ref 4.0–5.6)

## 2021-04-13 MED ORDER — RYBELSUS 3 MG PO TABS: 3.0000 mg | ORAL_TABLET | Freq: Every day | ORAL | 5 refills | Status: DC

## 2021-04-13 NOTE — Patient Instructions (Addendum)
Please continue: - Glyxambi 25-5 mg daily before b'fast - until you run out, then let me know so I can send Empagliflozin  Add: - Rybelsus 3 mg before b'fast - may need to increase the dose if you tolerate this well - let me know  Check sugars once a day, rotating check times.  Please return in 4 months with your sugar log.

## 2021-04-13 NOTE — Progress Notes (Signed)
Patient ID: MATTEA SEGER, female   DOB: [birth year], 60 y.o.   MRN: [id number]  This visit occurred during the SARS-CoV-2 public health emergency.  Safety protocols were in place, including screening questions prior to the visit, additional usage of staff PPE, and extensive cleaning of exam room while observing appropriate contact time as indicated for disinfecting solutions.    HPI: RAYMA HEGG is a 60 y.o.-year-old female, presenting for f/u for DM2, prev GDM, dx in 05/2014, non-insulin-dependent, uncontrolled, without complications. Last visit 4 months ago.  Interim history: She continues to go to the gym consistently. No increased urination, blurry vision, nausea, chest pain.  Reviewed HbA1c levels: Lab Results  Component Value Date   HGBA1C 7.0 (A) 12/11/2020   HGBA1C 7.1 (A) 08/07/2020   HGBA1C 7.2 (A) 04/03/2020   HGBA1C 7.1 (A) 12/06/2019   HGBA1C 7.1 (A) 07/27/2019   HGBA1C 6.6 (A) 03/22/2019   HGBA1C 6.4 (A) 05/05/2018   HGBA1C 6.4 (A) 10/28/2017   HGBA1C 6.0 04/01/2017   HGBA1C 6.2 10/01/2016   HGBA1C 6.2 09/19/2015   HGBA1C 6.1 06/23/2015   HGBA1C 6.3 02/18/2015   HGBA1C 8.3 (H) 09/05/2014   HGBA1C 11.8 (H) 06/19/2014   Pt is on a regimen of: - Glyxambi (empagliflozin-linagliptin) 25-5 mg daily before breakfast At last visit, we stopped glipizide XL 5 mg in a.m. >> she is feeling much better. She had nausea with Metformin before.  She is checking CBGs 0 to once a day: - am: 114-120 >> 120-149, ave 120s >> 108-233 >> 90s-118 - 2h after b'fast: n/c  - before lunch: n/c >> 183 - 2h after lunch:  160 >> n/c >> 109 >> n/c >> 130-140s - before dinner: n/c - 2h after dinner: n/c - bedtime: 130-160 >> 189 >> 118, 120s, 200 >> n/c  - nighttime: n/c Lowest sugar was 109 >> 108 >> 90s; it is unclear at which level she has hypoglycemia awareness. Highest sugar was 200 >> 233 >> 183.  Glucometer: One Touch Verio  Pt's meals are: - Breakfast: 2 Eggos + fruit - Lunch:  pita tuna; [employer]; yoghurt - Dinner: meat + veggies + spaghetti - Snacks: dill pickles, nuts, chocolate, potato chips, grapes She is on nutrition in the past and this helped.  She started exercising at the gym in 1998, when [employer] opened: Body pump, Zumba, but stopped due to the coronavirus pandemic.  She restarted afterwards.  -No CKD, last BUN/creatinine:  Lab Results  Component Value Date   BUN 17 03/22/2019   CREATININE 0.92 03/22/2019  On olmesartan.  -+ HL; latest set of lipids: Lab Results  Component Value Date   CHOL 169 07/26/2019   HDL 42.90 07/26/2019   LDLCALC 93 07/26/2019   LDLDIRECT 122.0 04/01/2017   TRIG 166.0 (H) 07/26/2019   CHOLHDL 4 07/26/2019  We started Crestor 5 in 02/2019.  She takes this most days of the week, but not quite every day due to mental fog and fatigue.  - last eye exam was in 2022: No DR reportedly.  Dr. Quay Burow in [place] [facility].  - she denies numbness and tingling in her feet.  She has a h/o diverticulitis >> part of colon removed in 2008.  Her husband is competing in bowling tournaments.    ROS: + see HPI  I reviewed pt's medications, allergies, PMH, social hx, family hx, and changes were documented in the history of present illness. Otherwise, unchanged from my initial visit note.  Past Medical History:  Diagnosis Date   Diabetes mellitus without complication ([place])    Diverticulitis    Hypertension    Past Surgical History:  Procedure Laterality Date   COLON SURGERY     History   Social History   Marital Status: Married    Spouse Name: N/A   Number of Children: 1   Occupational History      Social History Main Topics   Smoking status: Never Smoker    Smokeless tobacco: Not on file   Alcohol Use: No   Drug Use: No   Current Outpatient Medications on File Prior to Visit  Medication Sig Dispense Refill   clorazepate (TRANXENE) 7.5 MG tablet TAKE 1 TABLET BY MOUTH TWICE A DAY AS NEEDED FOR ANXIETY/SLEEP 60 tablet 0    Empagliflozin-linaGLIPtin (GLYXAMBI) 25-5 MG TABS Take 1 tablet by mouth daily. 90 tablet 3   fluticasone (FLONASE) 50 MCG/ACT nasal spray Place 2 sprays into both nostrils daily. 16 g 1   hydrochlorothiazide (HYDRODIURIL) 25 MG tablet TAKE 1 TABLET BY MOUTH EVERY DAY 90 tablet 3   olmesartan (BENICAR) 40 MG tablet TAKE 1 TABLET BY MOUTH EVERY DAY 90 tablet 3   ONETOUCH DELICA LANCETS FINE MISC 1 Device by Does not apply route daily as needed. 50 each 3   ONETOUCH VERIO test strip USE AS DIRECTED 3 TIMES A DAY TO CHECK BLOOD SUGAR 300 strip 1   pantoprazole (PROTONIX) 40 MG tablet      rosuvastatin (CRESTOR) 5 MG tablet TAKE 1 TABLET BY MOUTH EVERY DAY 90 tablet 3   No current facility-administered medications on file prior to visit.   Allergies  Allergen Reactions   Cetirizine Hcl     REACTION: fatigue   Metformin And Related Nausea And Vomiting   Penicillins Nausea And Vomiting   Valsartan     REACTION: rash   Family History  Problem Relation Age of Onset   Diabetes Mother    Stroke Mother    Cancer Father        colon or liver  Thyroid disease and hypertension in mother  PE: BP 124/76 (BP Location: Left Arm, Patient Position: Sitting, Cuff Size: Normal)   Pulse 90   Ht _0  (1.626 m)   Wt 173 lb 12.8 oz (78.8 kg)   SpO2 96%   BMI 29.83 kg/m  Body mass index is 29.83 kg/m.  Wt Readings from Last 3 Encounters:  04/13/21 173 lb 12.8 oz (78.8 kg)  12/11/20 170 lb 12.8 oz (77.5 kg)  11/05/20 168 lb (76.2 kg)   Constitutional: overweight, in NAD Eyes: PERRLA, EOMI, no exophthalmos ENT: moist mucous membranes, no thyromegaly, no cervical lymphadenopathy Cardiovascular: RRR, No MRG, no leg swelling Respiratory: CTA B Gastrointestinal: abdomen soft, NT, ND, BS+ Musculoskeletal: no deformities, strength intact in all 4 Skin: moist, warm, no rashes Neurological: no tremor with outstretched hands, DTR normal in all 4  ASSESSMENT: 1. DM2, non-insulin-dependent,  uncontrolled, without long term complications, but with hyperglycemia  2. HL  3. Overweight  PLAN:  1. Patient with h/o uncontrolled DM, on oral antidiabetic regimen with DPP 4 inhibitor and SGLT2 inhibitor.  In the past, she was also on glipizide XL but we were able to stop this due to improved blood sugars.  She had less mental fog and dizziness after stopping glipizide.  At last visit, sugars were quite variable, with the highest being in the 200s after eating sweets.  At that time, however, she was not checking sugars consistently and  there were not enough data points for [place] to change her regimen.  HbA1c was lower, at 7.0%.  I strongly advised that to check sugars more consistently and gave her a blood sugar log.   -At today's visit, she does not bring the log or the meter.  She is checking sugars in the morning and occasionally later in the day, but not in the evening.  I again advised her that this is very important, especially since her HbA1c increased now (see below). -At this visit, I suggested to add Rybelsus to [facility] until she runs out of Glyxambi so we can start empagliflozin.  At that time, we can also try to increase Rybelsus. -I suggested to:  Patient Instructions  Please continue: - Glyxambi 25-5 mg daily before b'fast - until you run out, then let me know so I can send Empagliflozin  Add: - Rybelsus 3 mg before b'fast - may need to increase the dose if you tolerate this well - let me know  Check sugars once a day, rotating check times.  Please return in 4 months with your sugar log.  - we checked her HbA1c: 7.3% (higher) - advised to check sugars at different times of the day - 1x a day, rotating check times - advised for yearly eye exams >> she is UTD - check annual labs today - return to clinic in 4 months  2. HL -Reviewed latest lipid panel from 07/2019: LDL lower than 100, triglycerides slightly high: Lab Results  Component Value Date   CHOL 169 07/26/2019    HDL 42.90 07/26/2019   LDLCALC 93 07/26/2019   LDLDIRECT 122.0 04/01/2017   TRIG 166.0 (H) 07/26/2019   CHOLHDL 4 07/26/2019  -She continues Crestor 5 mg qod.  She had mental fog and fatigue when she was taking it daily, but no side effects from the current regimen. -will recheck today  3. Obesity class 1 -Continues SGLT2 inh and will also start a GLP1 R agonist which should also help with weight loss -Weight was approximately stable at last visit  Component     Latest Ref Rng & Units 04/13/2021  Glucose     65 - 99 mg/dL 143 (H)  BUN     7 - 25 mg/dL 18  Creatinine     0.50 - 1.05 mg/dL 1.06 (H)  eGFR     > OR = 60 mL/min/1.74m 60  BUN/Creatinine Ratio     6 - 22 (calc) 17  Sodium     135 - 146 mmol/L 141  Potassium     3.5 - 5.3 mmol/L 3.9  Chloride     98 - 110 mmol/L 103  CO2     20 - 32 mmol/L 27  Calcium     8.6 - 10.4 mg/dL 9.4  Total Protein     6.1 - 8.1 g/dL 7.3  Albumin MSPROF     3.6 - 5.1 g/dL 4.5  Globulin     1.9 - 3.7 g/dL (calc) 2.8  AG Ratio     1.0 - 2.5 (calc) 1.6  Total Bilirubin     0.2 - 1.2 mg/dL 0.4  Alkaline phosphatase (APISO)     37 - 153 U/L 57  AST     10 - 35 U/L 16  ALT     6 - 29 U/L 18  Cholesterol     0 - 200 mg/dL 151  Triglycerides     0.0 - 149.0 mg/dL 190.0 (H)  HDL  Cholesterol     >39.00 mg/dL 50.50  VLDL     0.0 - 40.0 mg/dL 38.0  LDL (calc)     0 - 99 mg/dL 62  Total CHOL/HDL Ratio      3  NonHDL      100.28  Microalb, Ur     0.0 - 1.9 mg/dL <0.7  Creatinine,U     mg/dL 43.7  MICROALB/CREAT RATIO     0.0 - 30.0 mg/g 1.6  Triglycerides above target, but LDL at goal.   Philemon Kingdom, MD PhD [facility]

## 2021-04-14 LAB — COMPLETE METABOLIC PANEL WITH GFR
AG Ratio: 1.6 (calc) (ref 1.0–2.5)
ALT: 18 U/L (ref 6–29)
AST: 16 U/L (ref 10–35)
Albumin: 4.5 g/dL (ref 3.6–5.1)
Alkaline phosphatase (APISO): 57 U/L (ref 37–153)
BUN/Creatinine Ratio: 17 (calc) (ref 6–22)
BUN: 18 mg/dL (ref 7–25)
CO2: 27 mmol/L (ref 20–32)
Calcium: 9.4 mg/dL (ref 8.6–10.4)
Chloride: 103 mmol/L (ref 98–110)
Creat: 1.06 mg/dL — ABNORMAL HIGH (ref 0.50–1.05)
Globulin: 2.8 g/dL (calc) (ref 1.9–3.7)
Glucose, Bld: 143 mg/dL — ABNORMAL HIGH (ref 65–99)
Potassium: 3.9 mmol/L (ref 3.5–5.3)
Sodium: 141 mmol/L (ref 135–146)
Total Bilirubin: 0.4 mg/dL (ref 0.2–1.2)
Total Protein: 7.3 g/dL (ref 6.1–8.1)
eGFR: 60 mL/min/{1.73_m2} (ref >=60)

## 2021-04-14 LAB — LIPID PANEL
Cholesterol: 151 mg/dL (ref 0–200)
HDL: 50.5 mg/dL (ref >39.00)
LDL Cholesterol: 62 mg/dL (ref 0–99)
NonHDL: 100.28
Total CHOL/HDL Ratio: 3
Triglycerides: 190 mg/dL — ABNORMAL HIGH (ref 0.0–149.0)
VLDL: 38 mg/dL (ref 0.0–40.0)

## 2021-04-14 LAB — MICROALBUMIN / CREATININE URINE RATIO
Creatinine,U: 43.7 mg/dL
Microalb Creat Ratio: 1.6 mg/g (ref 0.0–30.0)
Microalb, Ur: <0.7 mg/dL (ref 0.0–1.9)

## 2021-06-22 ENCOUNTER — Other Ambulatory Visit: Payer: Self-pay | Admitting: Internal Medicine

## 2021-08-11 ENCOUNTER — Ambulatory Visit (INDEPENDENT_AMBULATORY_CARE_PROVIDER_SITE_OTHER): Payer: Managed Care, Other (non HMO) | Admitting: Internal Medicine

## 2021-08-11 ENCOUNTER — Encounter: Payer: Self-pay | Admitting: Internal Medicine

## 2021-08-11 ENCOUNTER — Other Ambulatory Visit: Payer: Self-pay

## 2021-08-11 VITALS — BP 110/70 | HR 96 | Ht 64.0 in | Wt 175.4 lb

## 2021-08-11 DIAGNOSIS — E785 Hyperlipidemia, unspecified: Secondary | ICD-10-CM

## 2021-08-11 DIAGNOSIS — E669 Obesity, unspecified: Secondary | ICD-10-CM | POA: Diagnosis not present

## 2021-08-11 DIAGNOSIS — E119 Type 2 diabetes mellitus without complications: Secondary | ICD-10-CM | POA: Diagnosis not present

## 2021-08-11 LAB — POCT GLYCOSYLATED HEMOGLOBIN (HGB A1C): Hemoglobin A1C: 7 % — AB (ref 4.0–5.6)

## 2021-08-11 MED ORDER — GLYXAMBI 25-5 MG PO TABS: 1.0000 | ORAL_TABLET | Freq: Every day | ORAL | 3 refills | Status: DC

## 2021-08-11 NOTE — Patient Instructions (Addendum)
Please continue: - Glyxambi 25-5 mg daily before b'fast.  Check sugars once a day, rotating check times.  Please return in 4 months with your sugar log. 

## 2021-08-11 NOTE — Addendum Note (Signed)
Addended by: Lauralyn Primes on: 08/11/2021 04:42 PM ? ? Modules accepted: Orders ? ?

## 2021-08-11 NOTE — Progress Notes (Signed)
Patient ID: Elizabeth Ware, female   DOB: 05-04-1961, 61 y.o.   MRN: [id number] ? ?This visit occurred during the SARS-CoV-2 public health emergency.  Safety protocols were in place, including screening questions prior to the visit, additional usage of staff PPE, and extensive cleaning of exam room while observing appropriate contact time as indicated for disinfecting solutions.   ? ?HPI: ?Elizabeth Ware is a 61 y.o.-year-old female, presenting for f/u for DM2, prev GDM, dx in 05/2014, non-insulin-dependent, uncontrolled, without complications. Last visit 4 months ago. ? ?Interim history: ?She continues to go to the gym consistently. ?No increased urination, blurry vision, nausea, chest pain. ? ?Reviewed HbA1c levels: ?Lab Results  ?Component Value Date  ? HGBA1C 7.3 (A) 04/13/2021  ? HGBA1C 7.0 (A) 12/11/2020  ? HGBA1C 7.1 (A) 08/07/2020  ? HGBA1C 7.2 (A) 04/03/2020  ? HGBA1C 7.1 (A) 12/06/2019  ? HGBA1C 7.1 (A) 07/27/2019  ? HGBA1C 6.6 (A) 03/22/2019  ? HGBA1C 6.4 (A) 05/05/2018  ? HGBA1C 6.4 (A) 10/28/2017  ? HGBA1C 6.0 04/01/2017  ? HGBA1C 6.2 10/01/2016  ? HGBA1C 6.2 09/19/2015  ? HGBA1C 6.1 06/23/2015  ? HGBA1C 6.3 02/18/2015  ? HGBA1C 8.3 (H) 09/05/2014  ? ?Pt is on a regimen of: ?- Glyxambi (empagliflozin-linagliptin) 25-5 mg daily before breakfast ?At last visit, I suggested to start Rybelsus and switch to Jardiance only after she finishes Glyxambi, but she did not start this.  She would like to continue with Glyxambi. ? ?At last visit, we stopped glipizide XL 5 mg in a.m. >> she is feeling much better. ?She had nausea with Metformin before. ? ?She is checking CBGs 0 to once a day: ?- am: 114-120 >> 120-149, ave 120s >> 108-233 >> 90s-118 >>  115-130 in January ?- 2h after b'fast: n/c  ?- before lunch: n/c >> 183 ?- 2h after lunch:  160 >> n/c >> 109 >> n/c >> 130-140s  >> n/c ?- before dinner: n/c ?- 2h after dinner: n/c ?- bedtime: 130-160 >> 189 >> 118, 120s, 200 >> n/c  ?- nighttime: n/c ?Lowest sugar was  109 >> 108 >> 90s; it is unclear at which level she has hypoglycemia awareness. ?Highest sugar was 200 >> 233 >> 183. ? ?Glucometer: One Touch Verio ? ?Pt's meals are: ?- Breakfast: 2 Eggos + fruit ?- Lunch: pita tuna; [employer]; yoghurt ?- Dinner: meat + veggies + spaghetti ?- Snacks: dill pickles, nuts, chocolate, potato chips, grapes ?She is on nutrition in the past and this helped. ? ?She started exercising at the gym in 1998, when [employer] opened: Body pump, Zumba, but stopped due to the coronavirus pandemic.  She restarted afterwards. ? ?-No CKD, last BUN/creatinine:  ?Lab Results  ?Component Value Date  ? BUN 18 04/13/2021  ? CREATININE 1.06 (H) 04/13/2021  ?On olmesartan. ? ?-+ HL; latest set of lipids: ?Lab Results  ?Component Value Date  ? CHOL 151 04/13/2021  ? HDL 50.50 04/13/2021  ? [place] 62 04/13/2021  ? LDLDIRECT 122.0 04/01/2017  ? TRIG 190.0 (H) 04/13/2021  ? CHOLHDL 3 04/13/2021  ?We started Crestor 5 in 02/2019.  She takes this most days of the week, but not quite every day due to mental fog and fatigue. ? ?- last eye exam was in 2022: No DR reportedly.  Dr. Quay Burow in [place] [facility]. ? ?- she denies numbness and tingling in her feet. ? ?She has a h/o diverticulitis >> part of colon removed in 2008. ? ?Her husband is competing in bowling  tournaments.   ? ?ROS: ?+ see HPI ? ?I reviewed pt's medications, allergies, PMH, social hx, family hx, and changes were documented in the history of present illness. Otherwise, unchanged from my initial visit note. ? ?Past Medical History:  ?Diagnosis Date  ? Diabetes mellitus without complication ([place])   ? Diverticulitis   ? Hypertension   ? ?Past Surgical History:  ?Procedure Laterality Date  ? COLON SURGERY    ? ?History  ? ?Social History  ? Marital Status: Married  ?  Spouse Name: N/A  ? Number of Children: 1  ? ?Occupational History  ?   ? ?Social History Main Topics  ? Smoking status: Never Smoker   ? Smokeless tobacco: Not on file  ? Alcohol Use: No  ? Drug  Use: No  ? ?Current Outpatient Medications on File Prior to Visit  ?Medication Sig Dispense Refill  ? clorazepate (TRANXENE) 7.5 MG tablet TAKE 1 TABLET BY MOUTH TWICE A DAY AS NEEDED FOR ANXIETY/SLEEP 60 tablet 0  ? Empagliflozin-linaGLIPtin (GLYXAMBI) 25-5 MG TABS Take 1 tablet by mouth daily. 90 tablet 3  ? fluticasone (FLONASE) 50 MCG/ACT nasal spray Place 2 sprays into both nostrils daily. 16 g 1  ? hydrochlorothiazide (HYDRODIURIL) 25 MG tablet TAKE 1 TABLET BY MOUTH EVERY DAY 90 tablet 3  ? olmesartan (BENICAR) 40 MG tablet TAKE 1 TABLET BY MOUTH EVERY DAY 90 tablet 3  ? ONETOUCH DELICA LANCETS FINE MISC 1 Device by Does not apply route daily as needed. 50 each 3  ? ONETOUCH VERIO test strip USE AS DIRECTED 3 TIMES A DAY TO CHECK BLOOD SUGAR 300 strip 1  ? pantoprazole (PROTONIX) 40 MG tablet     ? rosuvastatin (CRESTOR) 5 MG tablet TAKE 1 TABLET BY MOUTH EVERY DAY 90 tablet 3  ? Semaglutide (RYBELSUS) 3 MG TABS Take 3 mg by mouth daily. 30 tablet 5  ? ?No current facility-administered medications on file prior to visit.  ? ?Allergies  ?Allergen Reactions  ? Cetirizine Hcl   ?  REACTION: fatigue  ? Metformin And Related Nausea And Vomiting  ? Penicillins Nausea And Vomiting  ? Valsartan   ?  REACTION: rash  ? ?Family History  ?Problem Relation Age of Onset  ? Diabetes Mother   ? Stroke Mother   ? Cancer Father   ?     colon or liver  ?Thyroid disease and hypertension in mother ? ?PE: ?BP 110/70 (BP Location: Left Arm, Patient Position: Sitting, Cuff Size: Normal)   Pulse 96   Ht 5\' 4"  (1.626 m)   Wt 175 lb 6.4 oz (79.6 kg)   SpO2 96%   BMI 30.11 kg/m?  Body mass index is 30.11 kg/m?. ? ?Wt Readings from Last 3 Encounters:  ?08/11/21 175 lb 6.4 oz (79.6 kg)  ?04/13/21 173 lb 12.8 oz (78.8 kg)  ?12/11/20 170 lb 12.8 oz (77.5 kg)  ? ?Constitutional: overweight, in NAD ?Eyes: PERRLA, EOMI, no exophthalmos ?ENT: moist mucous membranes, no thyromegaly, no cervical lymphadenopathy ?Cardiovascular: RRR, No MRG,  no leg swelling ?Respiratory: CTA B ?Musculoskeletal: no deformities, strength intact in all 4 ?Skin: moist, warm, no rashes ?Neurological: no tremor with outstretched hands, DTR normal in all 4 ?Diabetic Foot Exam - Simple   ?Simple Foot Form ?Diabetic Foot exam was performed with the following findings: Yes 08/11/2021  4:25 PM  ?Visual Inspection ?No deformities, no ulcerations, no other skin breakdown bilaterally: Yes ?See comments: Yes ?Sensation Testing ?Intact to touch and monofilament testing bilaterally:  Yes ?Pulse Check ?Posterior Tibialis and Dorsalis pulse intact bilaterally: Yes ?Comments ?B hallux onychomycosis ?  ? ? ?ASSESSMENT: ?1. DM2, non-insulin-dependent, uncontrolled, without long term complications, but with hyperglycemia ? ?2. HL ? ?3. Overweight ? ?PLAN:  ?1. Patient with history of uncontrolled type 2 diabetes, on oral antidiabetic regimen with DPP 4 inhibitor and SGLT2 inhibitor.  In the past, she was also on glipizide XL but we were able to stop this due to improved blood sugars.  She had less mental fog and dizziness after stopping the sulfonylurea.  She does not usually check blood sugars consistently enough to understand trends and we basically manage her diabetes based on her HbA1c levels. ?-At last visit, HbA1c was higher, at 7.3%.  I suggested to add Rybelsus to [facility] until she ran out of Glyxambi so we could start empagliflozin.  However, she did not do so as she wanted to continue to stay on Glyxambi.  She mentions that she read that Rybelsus can cause cancers and would prefer not to continue it.  We discussed about possible side effects of Rybelsus and the safety profile but she would like to continue without it. ?-For now, we reviewed the few blood sugars that she checked and they were slightly above goal in the morning but she mentions that she did not check them in the last 2 months.  I again strongly advised her to start but would not change the regimen for now.  I gave her  a coupon card for [facility]. ?-I suggested to:  ?Patient Instructions  ?Please continue: ?- Glyxambi 25-5 mg daily before b'fast ? ?Check sugars once a day, rotating check times. ? ?Please return in 4 months

## 2021-08-22 ENCOUNTER — Other Ambulatory Visit: Payer: Self-pay | Admitting: Internal Medicine

## 2021-09-29 ENCOUNTER — Other Ambulatory Visit: Payer: Self-pay | Admitting: Internal Medicine

## 2021-09-29 DIAGNOSIS — Z1231 Encounter for screening mammogram for malignant neoplasm of breast: Secondary | ICD-10-CM

## 2021-10-13 ENCOUNTER — Ambulatory Visit: Payer: Managed Care, Other (non HMO)

## 2021-10-14 ENCOUNTER — Ambulatory Visit
Admission: RE | Admit: 2021-10-14 | Discharge: 2021-10-14 | Disposition: A | Discharge status: Home / Self Care | Payer: Managed Care, Other (non HMO) | Source: Ambulatory Visit | Attending: Internal Medicine | Admitting: Internal Medicine

## 2021-10-14 DIAGNOSIS — Z1231 Encounter for screening mammogram for malignant neoplasm of breast: Secondary | ICD-10-CM

## 2021-12-11 ENCOUNTER — Ambulatory Visit: Payer: Managed Care, Other (non HMO) | Admitting: Internal Medicine

## 2021-12-11 NOTE — Progress Notes (Deleted)
Patient ID: Elizabeth Ware, female   DOB: [birth year], 61 y.o.   MRN: [id number]  HPI: Elizabeth Ware is a 61 y.o.-year-old female, presenting for f/u for DM2, prev GDM, dx in 05/2014, non-insulin-dependent, uncontrolled, without complications. Last visit 4 months ago.  Interim history: She continues to go to the gym consistently. No increased urination, blurry vision, nausea, chest pain.  Reviewed HbA1c levels: Lab Results  Component Value Date   HGBA1C 7.0 (A) 08/11/2021   HGBA1C 7.3 (A) 04/13/2021   HGBA1C 7.0 (A) 12/11/2020   HGBA1C 7.1 (A) 08/07/2020   HGBA1C 7.2 (A) 04/03/2020   HGBA1C 7.1 (A) 12/06/2019   HGBA1C 7.1 (A) 07/27/2019   HGBA1C 6.6 (A) 03/22/2019   HGBA1C 6.4 (A) 05/05/2018   HGBA1C 6.4 (A) 10/28/2017   HGBA1C 6.0 04/01/2017   HGBA1C 6.2 10/01/2016   HGBA1C 6.2 09/19/2015   HGBA1C 6.1 06/23/2015   HGBA1C 6.3 02/18/2015   Pt is on a regimen of: - Glyxambi (empagliflozin-linagliptin) 25-5 mg daily before breakfast At last visit, I suggested to start Rybelsus and switch to Jardiance only after she finishes Glyxambi, but she did not start this.  She would like to continue with Glyxambi.  At last visit, we stopped glipizide XL 5 mg in a.m. >> she is feeling much better. She had nausea with Metformin before.  She is checking CBGs 0 to once a day: - am: 120-149, ave 120s >> 108-233 >> 90s-118 >>  115-130  - 2h after b'fast: n/c  - before lunch: n/c >> 183 - 2h after lunch:  160 >> n/c >> 109 >> n/c >> 130-140s  >> n/c - before dinner: n/c - 2h after dinner: n/c - bedtime: 130-160 >> 189 >> 118, 120s, 200 >> n/c  - nighttime: n/c Lowest sugar was 109 >> 108 >> 90s; it is unclear at which level she has hypoglycemia awareness. Highest sugar was 200 >> 233 >> 183.  Glucometer: One Touch Verio  Pt's meals are: - Breakfast: 2 Eggos + fruit - Lunch: pita tuna; [employer]; yoghurt - Dinner: meat + veggies + spaghetti - Snacks: dill pickles, nuts, chocolate, potato  chips, grapes She is on nutrition in the past and this helped.  She started exercising at the gym in 1998, when [employer] opened: Body pump, Zumba, but stopped due to the coronavirus pandemic.  She restarted afterwards.  -No CKD, last BUN/creatinine:  Lab Results  Component Value Date   BUN 18 04/13/2021   CREATININE 1.06 (H) 04/13/2021  On olmesartan.  -+ HL; latest set of lipids: Lab Results  Component Value Date   CHOL 151 04/13/2021   HDL 50.50 04/13/2021   LDLCALC 62 04/13/2021   LDLDIRECT 122.0 04/01/2017   TRIG 190.0 (H) 04/13/2021   CHOLHDL 3 04/13/2021  We started Crestor 5 in 02/2019.  She takes this most days of the week, but not quite every day due to mental fog and fatigue.  - last eye exam was in 2022: No DR reportedly.  Dr. Lawerance Bach in [facility] [facility].  - she denies numbness and tingling in her feet.  Last foot exam 07/2021.  She has a h/o diverticulitis >> part of colon removed in 2008.  Her husband is competing in bowling tournaments.    ROS: + see HPI  I reviewed pt's medications, allergies, PMH, social hx, family hx, and changes were documented in the history of present illness. Otherwise, unchanged from my initial visit note.  Past Medical History:  Diagnosis Date  Diabetes mellitus without complication (HCC)    Diverticulitis    Hypertension    Past Surgical History:  Procedure Laterality Date   COLON SURGERY     History   Social History   Marital Status: Married    Spouse Name: N/A   Number of Children: 1   Occupational History      Social History Main Topics   Smoking status: Never Smoker    Smokeless tobacco: Not on file   Alcohol Use: No   Drug Use: No   Current Outpatient Medications on File Prior to Visit  Medication Sig Dispense Refill   clorazepate (TRANXENE) 7.5 MG tablet TAKE 1 TABLET BY MOUTH TWICE A DAY AS NEEDED FOR ANXIETY/SLEEP 60 tablet 0   Empagliflozin-linaGLIPtin (GLYXAMBI) 25-5 MG TABS Take 1 tablet by mouth daily.  90 tablet 3   fluticasone (FLONASE) 50 MCG/ACT nasal spray Place 2 sprays into both nostrils daily. 16 g 1   hydrochlorothiazide (HYDRODIURIL) 25 MG tablet TAKE 1 TABLET BY MOUTH EVERY DAY 90 tablet 3   olmesartan (BENICAR) 40 MG tablet TAKE 1 TABLET BY MOUTH EVERY DAY 90 tablet 3   ONETOUCH DELICA LANCETS FINE MISC 1 Device by Does not apply route daily as needed. 50 each 3   ONETOUCH VERIO test strip USE AS DIRECTED 3 TIMES A DAY TO CHECK BLOOD SUGAR 300 strip 1   pantoprazole (PROTONIX) 40 MG tablet      rosuvastatin (CRESTOR) 5 MG tablet TAKE 1 TABLET BY MOUTH EVERY DAY 90 tablet 3   No current facility-administered medications on file prior to visit.   Allergies  Allergen Reactions   Cetirizine Hcl     REACTION: fatigue   Metformin And Related Nausea And Vomiting   Penicillins Nausea And Vomiting   Valsartan     REACTION: rash   Family History  Problem Relation Age of Onset   Diabetes Mother    Stroke Mother    Cancer Father        colon or liver  Thyroid disease and hypertension in mother  PE: There were no vitals taken for this visit. There is no height or weight on file to calculate BMI.  Wt Readings from Last 3 Encounters:  08/11/21 175 lb 6.4 oz (79.6 kg)  04/13/21 173 lb 12.8 oz (78.8 kg)  12/11/20 170 lb 12.8 oz (77.5 kg)   Constitutional: overweight, in NAD Eyes: EOMI, no exophthalmos ENT: moist mucous membranes, no thyromegaly, no cervical lymphadenopathy Cardiovascular: RRR, No MRG, no leg swelling Respiratory: CTA B Musculoskeletal: no deformities Skin: moist, warm, no rashes Neurological: no tremor with outstretched hands  ASSESSMENT: 1. DM2, non-insulin-dependent, uncontrolled, without long term complications, but with hyperglycemia  2. HL  3. Overweight  PLAN:  1. Patient with history of uncontrolled type 2 diabetes, on oral antidiabetic regimen, with improved control at last visit, when an HbA1c level returned 7.0%.  She is currently on DPP 4  inhibitor and SGLT2 inhibitor.  In the past, she was also on glipizide XL, but we were able to stop this due to improved blood sugars.  We did discuss about switching from [facility] to Rybelsus and Jardiance, but she did not want to do this.  She mentions that she had read that Rybelsus can cause cancers, and would prefer not to use it.  We did discuss about the excellent side effect profile of Rybelsus, but she did not want to start it.  At last visit, we reviewed the few blood sugars that  she checked at home and they were slightly above goal in the morning, but she did not check in the previous 2 months before last visit.  I strongly advised her to check consistently, once a day, rotating check times.  We did not change the regimen.  I gave her a coupon card for [facility].  -I suggested to:  Patient Instructions  Please continue: - Glyxambi 25-5 mg daily before b'fast  Check sugars once a day, rotating check times.  Please return in 4 months with your sugar log.  - we checked her HbA1c: 7%  - advised to check sugars at different times of the day - 1x a day, rotating check times - advised for yearly eye exams >> she is UTD  - she has onychomycosis on bilateral big toes observed at last foot exam from 07/2021.  She was interested to try Vicks VapoRub. - return to clinic in 4 months  2. HL -Reviewed latest lipid panel from 03/2021: LDL at goal, triglycerides slightly high: Lab Results  Component Value Date   CHOL 151 04/13/2021   HDL 50.50 04/13/2021   LDLCALC 62 04/13/2021   LDLDIRECT 122.0 04/01/2017   TRIG 190.0 (H) 04/13/2021   CHOLHDL 3 04/13/2021  -She continues on Crestor 5 mg every other day.  She had mental fog and fatigue when she was taking it daily but no side effects from the current regimen  3. Obesity class 1 -continue SGLT 2 inhibitor and GLP-1 receptor agonist which should also help with weight loss -She gained 2 pounds before last visit  Carlus Pavlov, MD  PhD [facility]ocrin[facility]

## 2022-01-11 ENCOUNTER — Telehealth: Payer: Self-pay

## 2022-01-11 NOTE — Telephone Encounter (Signed)
Pt lvm to verify upcoming appointment. Pt n/s last appt and needs to reschedule.

## 2022-01-12 LAB — HM DIABETES EYE EXAM

## 2022-03-22 ENCOUNTER — Ambulatory Visit
Admission: RE | Admit: 2022-03-22 | Discharge: 2022-03-22 | Disposition: A | Discharge status: Home / Self Care | Payer: Managed Care, Other (non HMO) | Source: Ambulatory Visit | Attending: Gastroenterology | Admitting: Gastroenterology

## 2022-03-22 ENCOUNTER — Other Ambulatory Visit: Payer: Self-pay | Admitting: Gastroenterology

## 2022-03-22 DIAGNOSIS — Z8719 Personal history of other diseases of the digestive system: Secondary | ICD-10-CM

## 2022-03-22 DIAGNOSIS — Z9049 Acquired absence of other specified parts of digestive tract: Secondary | ICD-10-CM

## 2022-03-22 DIAGNOSIS — R1032 Left lower quadrant pain: Secondary | ICD-10-CM

## 2022-03-22 MED ORDER — IOPAMIDOL (ISOVUE-300) INJECTION 61%
100.0000 mL | Freq: Once | INTRAVENOUS | Status: AC | PRN
  Administered 2022-03-22: 100 mL via INTRAVENOUS

## 2022-04-21 ENCOUNTER — Ambulatory Visit: Payer: Managed Care, Other (non HMO) | Admitting: Internal Medicine

## 2022-05-15 ENCOUNTER — Other Ambulatory Visit: Payer: Self-pay | Admitting: Internal Medicine

## 2022-06-10 ENCOUNTER — Ambulatory Visit (INDEPENDENT_AMBULATORY_CARE_PROVIDER_SITE_OTHER): Payer: Managed Care, Other (non HMO) | Admitting: Internal Medicine

## 2022-06-10 ENCOUNTER — Encounter: Payer: Self-pay | Admitting: Internal Medicine

## 2022-06-10 VITALS — BP 118/62 | HR 112 | Ht 64.0 in | Wt 176.0 lb

## 2022-06-10 DIAGNOSIS — E119 Type 2 diabetes mellitus without complications: Secondary | ICD-10-CM | POA: Diagnosis not present

## 2022-06-10 DIAGNOSIS — E669 Obesity, unspecified: Secondary | ICD-10-CM | POA: Diagnosis not present

## 2022-06-10 DIAGNOSIS — E785 Hyperlipidemia, unspecified: Secondary | ICD-10-CM | POA: Diagnosis not present

## 2022-06-10 LAB — POCT GLYCOSYLATED HEMOGLOBIN (HGB A1C): Hemoglobin A1C: 7.4 % — AB (ref 4.0–5.6)

## 2022-06-10 MED ORDER — METFORMIN HCL ER 500 MG PO TB24: 500.0000 mg | ORAL_TABLET | Freq: Every day | ORAL | 3 refills | Status: DC

## 2022-06-10 NOTE — Progress Notes (Signed)
Patient ID: LACYE MCCARN, female   DOB: 10/13/1960, 62 y.o.   MRN: [id number]  HPI: Elizabeth Ware is a 62 y.o.-year-old female, presenting for f/u for DM2, prev GDM, dx in 05/2014, non-insulin-dependent, uncontrolled, without complications. Last visit 10 months ago.  Interim history: She continues to go to the gym consistently. No increased urination, blurry vision, nausea, chest pain. She lost a little weight as she started to work on her diet - 03/2022.  Reviewed HbA1c levels: Lab Results  Component Value Date   HGBA1C 7.0 (A) 08/11/2021   HGBA1C 7.3 (A) 04/13/2021   HGBA1C 7.0 (A) 12/11/2020   HGBA1C 7.1 (A) 08/07/2020   HGBA1C 7.2 (A) 04/03/2020   HGBA1C 7.1 (A) 12/06/2019   HGBA1C 7.1 (A) 07/27/2019   HGBA1C 6.6 (A) 03/22/2019   HGBA1C 6.4 (A) 05/05/2018   HGBA1C 6.4 (A) 10/28/2017   HGBA1C 6.0 04/01/2017   HGBA1C 6.2 10/01/2016   HGBA1C 6.2 09/19/2015   HGBA1C 6.1 06/23/2015   HGBA1C 6.3 02/18/2015   Pt is on a regimen of: - Glyxambi (empagliflozin-linagliptin) 25-5 mg daily before breakfast I suggested to start Rybelsus and switch to Jardiance only after she finished Glyxambi, but she did not start this.  She prefered to continue with Glyxambi. At last visit, we stopped glipizide XL 5 mg in a.m. >> she is feeling much better. She had nausea with Metformin before.  She is checking CBGs 0 to once a day: - am:  108-233 >> 90s-118 >>  115-130 >> 120-130, 161 - 2h after b'fast: n/c  - before lunch: n/c >> 183 >> n/c - 2h after lunch: n/c >> 109 >> n/c >> 130-140s  >> n/c - before dinner: n/c - 2h after dinner: n/c - bedtime: 130-160 >> 189 >> 118, 120s, 200 >> n/c  - nighttime: n/c Lowest sugar was 109 >> 108 >> 90s >> 120; it is unclear at which level she has hypoglycemia awareness. Highest sugar was 200 >> 233 >> 183 >> 161.  Glucometer: One Touch Verio  Pt's meals are: - Breakfast: 2 Eggos + fruit - Lunch: pita tuna; [employer]; yoghurt - Dinner: meat + veggies +  spaghetti - Snacks: dill pickles, nuts, chocolate, potato chips, grapes She is on nutrition in the past and this helped.  She started exercising at the gym in 1998, when [employer] opened: Body pump, Zumba, but stopped due to the coronavirus pandemic.  She restarted afterwards.  -No CKD, last BUN/creatinine:  Lab Results  Component Value Date   BUN 18 04/13/2021   CREATININE 1.06 (H) 04/13/2021  On olmesartan.  -+ HL; latest set of lipids: Lab Results  Component Value Date   CHOL 151 04/13/2021   HDL 50.50 04/13/2021   LDLCALC 62 04/13/2021   LDLDIRECT 122.0 04/01/2017   TRIG 190.0 (H) 04/13/2021   CHOLHDL 3 04/13/2021  We started Crestor 5 in 02/2019.  She takes this most days of the week, but not quite every day due to mental fog and fatigue.  - last eye exam was 01/12/2022: No DR reportedly.  Dr. Quay Burow in [place] [facility].  - she denies numbness and tingling in her feet.  Last foot exam 08/11/2021.  She has a h/o diverticulitis >> part of colon removed in 2008.  Her husband is competing in bowling tournaments.    ROS: + see HPI  I reviewed pt's medications, allergies, PMH, social hx, family hx, and changes were documented in the history of present illness. Otherwise, unchanged from my  initial visit note.  Past Medical History:  Diagnosis Date   Diabetes mellitus without complication ([place])    Diverticulitis    Hypertension    Past Surgical History:  Procedure Laterality Date   COLON SURGERY     History   Social History   Marital Status: Married    Spouse Name: N/A   Number of Children: 1   Occupational History      Social History Main Topics   Smoking status: Never Smoker    Smokeless tobacco: Not on file   Alcohol Use: No   Drug Use: No   Current Outpatient Medications on File Prior to Visit  Medication Sig Dispense Refill   clorazepate (TRANXENE) 7.5 MG tablet TAKE 1 TABLET BY MOUTH TWICE A DAY AS NEEDED FOR ANXIETY/SLEEP 60 tablet 0    Empagliflozin-linaGLIPtin (GLYXAMBI) 25-5 MG TABS Take 1 tablet by mouth daily. 90 tablet 3   fluticasone (FLONASE) 50 MCG/ACT nasal spray Place 2 sprays into both nostrils daily. 16 g 1   hydrochlorothiazide (HYDRODIURIL) 25 MG tablet TAKE 1 TABLET BY MOUTH EVERY DAY 90 tablet 3   olmesartan (BENICAR) 40 MG tablet TAKE 1 TABLET BY MOUTH EVERY DAY 90 tablet 3   ONETOUCH DELICA LANCETS FINE MISC 1 Device by Does not apply route daily as needed. 50 each 3   ONETOUCH VERIO test strip USE AS DIRECTED 3 TIMES A DAY TO CHECK BLOOD SUGAR 300 strip 1   pantoprazole (PROTONIX) 40 MG tablet      rosuvastatin (CRESTOR) 5 MG tablet TAKE 1 TABLET BY MOUTH EVERY DAY 90 tablet 3   No current facility-administered medications on file prior to visit.   Allergies  Allergen Reactions   Cetirizine Hcl     REACTION: fatigue   Metformin And Related Nausea And Vomiting   Penicillins Nausea And Vomiting   Valsartan     REACTION: rash   Family History  Problem Relation Age of Onset   Diabetes Mother    Stroke Mother    Cancer Father        colon or liver  Thyroid disease and hypertension in mother  PE: BP 118/62 (BP Location: Right Arm, Patient Position: Sitting, Cuff Size: Normal)   Pulse (!) 112   Ht 5\' 4"  (1.626 m)   Wt 176 lb (79.8 kg)   SpO2 96%   BMI 30.21 kg/m    Wt Readings from Last 3 Encounters:  06/10/22 176 lb (79.8 kg)  08/11/21 175 lb 6.4 oz (79.6 kg)  04/13/21 173 lb 12.8 oz (78.8 kg)   Constitutional: overweight, in NAD Eyes:  EOMI, no exophthalmos ENT: no neck masses, no cervical lymphadenopathy Cardiovascular: Tachycardia, RR, No MRG Respiratory: CTA B Musculoskeletal: no deformities Skin:no rashes Neurological: no tremor with outstretched hands  ASSESSMENT: 1. DM2, non-insulin-dependent, uncontrolled, without long term complications, but with hyperglycemia  2. HL  3. Overweight  PLAN:  1. Patient with history of uncontrolled type 2 diabetes, on oral antidiabetic  regimen with DPP 4 inhibitor and SGLT2 inhibitor.  In the past, she was also on glipizide XL but we were able to stop this due to improved blood sugars.  She had less mental fog and dizziness after stopping the sulfonylurea.  She is usually not checking blood sugars consistently enough to understand trends so we basically manage her diabetes relying on the HbA1c levels.  At last visit, HbA1c was lower, at 7.0%.  Sugars were slightly above goal in the morning but were not checked in  the previous 2 months.  Therefore, we could not change her regimen. -Of note, in the past I suggested Rybelsus but she read that this could cause cancers and did not start it.  We did review together the possible side effects of Rybelsus and the safety profile but she declined this. -At today's visit, she tells me that she is seeing higher blood sugars in the morning.  She reports sugars been in the 120s and 160s.  We discussed about continuing Glyxambi but also starting a low-dose metformin extended release with dinner.  She had nausea in the past with the regular metformin so we discussed about taking the extended release metformin with dinner.  She agrees with this. -I suggested to:  Patient Instructions  Please continue: - Glyxambi 25-5 mg daily before b'fast  Start: - Metformin ER 500 mg with dinner  Check sugars once a day, rotating check times.  Please return in 4-6 months with your sugar log.  - we checked her HbA1c: 7.4% (higher) - advised to check sugars at different times of the day - 1x a day, rotating check times - advised for yearly eye exams >> she is UTD - she is due for annual labs.  She is also due to have an annual physical exam.  We discussed about scheduling this with Dr. Posey Rea.  - return to clinic in 4-6 months  2. HL -Reviewed latest lipid panel from 03/2021: Triglycerides elevated, but LDL at goal: Lab Results  Component Value Date   CHOL 151 04/13/2021   HDL 50.50 04/13/2021    LDLCALC 62 04/13/2021   LDLDIRECT 122.0 04/01/2017   TRIG 190.0 (H) 04/13/2021   CHOLHDL 3 04/13/2021  -She continues on Crestor 5 mg every other day.  She had mental fog and fatigue when she was taking it daily but no side effects from the current regimen  3. Obesity class 1 -continue SGLT 2 inhibitor and GLP-1 receptor agonist which should also help with weight loss -She gained 2 pounds before last visit -She gained 1 pound since last visit  Carlus Pavlov, MD PhD [facility] Endocrin[facility]

## 2022-06-10 NOTE — Patient Instructions (Addendum)
Please continue: - Glyxambi 25-5 mg daily before b'fast  Start: - Metformin ER 500 mg with dinner  Check sugars once a day, rotating check times.  Please return in 4-6 months with your sugar log.

## 2022-06-21 ENCOUNTER — Other Ambulatory Visit: Payer: Self-pay | Admitting: Internal Medicine

## 2022-06-30 ENCOUNTER — Telehealth: Payer: Self-pay

## 2022-06-30 DIAGNOSIS — E119 Type 2 diabetes mellitus without complications: Secondary | ICD-10-CM

## 2022-06-30 NOTE — Telephone Encounter (Signed)
Pt called to advise Metformin is making her nauseous and sick. Requesting a replacement.

## 2022-07-01 NOTE — Telephone Encounter (Signed)
I would suggest to stop Glyxambi and switch to Jardiance only (this is part of Glyxambi) and add Rybelsus, as discussed previously.  Please let me know if she agrees with this.  If not, we can try to add glipizide, for example, but this is not as good as Rybelsus.

## 2022-07-01 NOTE — Telephone Encounter (Signed)
Detailed message left for pt per provider:  I would suggest to stop Glyxambi and switch to Jardiance only (this is part of Glyxambi) and add Rybelsus, as discussed previously.  Please let me know if she agrees with this.  If not, we can try to add glipizide, for example, but this is not as good as Rybelsus.  Pt advised to contact office.

## 2022-07-05 MED ORDER — EMPAGLIFLOZIN 25 MG PO TABS: 25.0000 mg | ORAL_TABLET | Freq: Every day | ORAL | 1 refills | Status: DC

## 2022-07-05 MED ORDER — RYBELSUS 7 MG PO TABS: 7.0000 mg | ORAL_TABLET | Freq: Every day | ORAL | 1 refills | Status: DC

## 2022-07-05 NOTE — Telephone Encounter (Signed)
Patient is calling to say that she checked with her pharmacy several times and they have not received a prescription for Jardiance.  Patient uses   [id number] [place](Ph: [phone]     Patient would like a return phone call after prescription has been sent in.

## 2022-07-05 NOTE — Telephone Encounter (Signed)
Pt contacted and advised of rx sent to pharmacy.

## 2022-07-05 NOTE — Telephone Encounter (Signed)
T, I would suggest to send the 25 mg of Jardiance to take along with Rybelsus 3.5 mg (half of a 7 mg tablet) in the morning.  I would recommend to take only half a tablet of Rybelsus for a week and then try to increase to 7 mg (1 full tablet) daily if tolerated well.

## 2022-07-26 ENCOUNTER — Other Ambulatory Visit: Payer: Self-pay | Admitting: Internal Medicine

## 2022-07-26 ENCOUNTER — Other Ambulatory Visit: Payer: Self-pay

## 2022-07-26 MED ORDER — HYDROCHLOROTHIAZIDE 25 MG PO TABS: 25.0000 mg | ORAL_TABLET | Freq: Every day | ORAL | 1 refills | Status: DC

## 2022-07-26 NOTE — Telephone Encounter (Signed)
Pt called and lvm she has not been able to get through Dr Plotnikov's office and needs a refill for hydrochlorothiazide (HYDRODIURIL) 25 MG tablet

## 2022-07-26 NOTE — Telephone Encounter (Signed)
Refill already been sent.Marland KitchenJohny Ware

## 2022-08-04 ENCOUNTER — Other Ambulatory Visit: Payer: Self-pay | Admitting: Internal Medicine

## 2022-08-06 NOTE — Telephone Encounter (Signed)
Pt called and requested to be put back on Glyxambi. She is not liking how her current regimen is making her feel. She states she feel terrible. She is willing to add Glipizide as well. Please advise.

## 2022-08-09 ENCOUNTER — Other Ambulatory Visit: Payer: Self-pay | Admitting: Internal Medicine

## 2022-08-09 MED ORDER — GLYXAMBI 25-5 MG PO TABS: 1.0000 | ORAL_TABLET | Freq: Every day | ORAL | 1 refills | Status: DC

## 2022-08-09 MED ORDER — GLIPIZIDE ER 2.5 MG PO TB24: 2.5000 mg | ORAL_TABLET | Freq: Every day | ORAL | 3 refills | Status: DC

## 2022-08-09 NOTE — Telephone Encounter (Signed)
Called and confirmed pt is currently taking Glyxambi 25-5 mg daily. She can not take Metformin she can not tolerate it. She stopped taking the Jardiance and Rybelsus. She sates she was on Glipizide a while ago and is ok with taking that again.

## 2022-08-09 NOTE — Telephone Encounter (Signed)
OK, I called in a prescription for glipizide extended release 2.5 mg to be taken before breakfast.  If sugars remain high, she can increase it to 5 mg (2 tablets). Ty! C

## 2022-08-09 NOTE — Telephone Encounter (Signed)
Called and lvm for pt advising a prescription for glipizide extended release 2.5 mg to be taken before breakfast. If sugars remain high, she can increase it to 5 mg (2 tablets).

## 2022-09-06 ENCOUNTER — Other Ambulatory Visit: Payer: Self-pay | Admitting: Internal Medicine

## 2022-09-06 DIAGNOSIS — Z1231 Encounter for screening mammogram for malignant neoplasm of breast: Secondary | ICD-10-CM

## 2022-10-19 ENCOUNTER — Ambulatory Visit
Admission: RE | Admit: 2022-10-19 | Discharge: 2022-10-19 | Disposition: A | Discharge status: Home / Self Care | Payer: Managed Care, Other (non HMO) | Source: Ambulatory Visit | Attending: Internal Medicine | Admitting: Internal Medicine

## 2022-10-19 DIAGNOSIS — Z1231 Encounter for screening mammogram for malignant neoplasm of breast: Secondary | ICD-10-CM

## 2022-10-24 ENCOUNTER — Other Ambulatory Visit: Payer: Self-pay | Admitting: Internal Medicine

## 2022-11-04 ENCOUNTER — Other Ambulatory Visit: Payer: Self-pay | Admitting: Internal Medicine

## 2022-11-04 DIAGNOSIS — E119 Type 2 diabetes mellitus without complications: Secondary | ICD-10-CM

## 2022-11-11 ENCOUNTER — Encounter: Payer: Self-pay | Admitting: Internal Medicine

## 2022-11-11 ENCOUNTER — Ambulatory Visit (INDEPENDENT_AMBULATORY_CARE_PROVIDER_SITE_OTHER): Payer: Managed Care, Other (non HMO) | Admitting: Internal Medicine

## 2022-11-11 VITALS — BP 122/80 | HR 93 | Ht 64.0 in | Wt 178.8 lb

## 2022-11-11 DIAGNOSIS — E119 Type 2 diabetes mellitus without complications: Secondary | ICD-10-CM | POA: Diagnosis not present

## 2022-11-11 DIAGNOSIS — E785 Hyperlipidemia, unspecified: Secondary | ICD-10-CM | POA: Diagnosis not present

## 2022-11-11 DIAGNOSIS — E669 Obesity, unspecified: Secondary | ICD-10-CM

## 2022-11-11 DIAGNOSIS — Z7984 Long term (current) use of oral hypoglycemic drugs: Secondary | ICD-10-CM | POA: Diagnosis not present

## 2022-11-11 DIAGNOSIS — E66811 Obesity, class 1: Secondary | ICD-10-CM

## 2022-11-11 LAB — POCT GLYCOSYLATED HEMOGLOBIN (HGB A1C): Hemoglobin A1C: 7.5 % — AB (ref 4.0–5.6)

## 2022-11-11 NOTE — Patient Instructions (Addendum)
Please move: - Glyxambi 25-5 mg daily before b'fast  Continue: - Glipizide ER 2.5 mg before b'fast  Check some sugars later in the day.  Please return in 3 months with your sugar log.

## 2022-11-11 NOTE — Progress Notes (Signed)
Patient ID: Elizabeth Ware, female   DOB: 12-16-1960, 62 y.o.   MRN: [id number]  HPI: Elizabeth Ware is a 62 y.o.-year-old female, presenting for f/u for DM2, prev GDM, dx in 05/2014, non-insulin-dependent, uncontrolled, without complications. Last visit 5 months ago.  Interim history: She retired since last visit. She continues to go to the gym consistently. No increased urination, blurry vision, nausea, chest pain.  Reviewed HbA1c levels: Lab Results  Component Value Date   HGBA1C 7.4 (A) 06/10/2022   HGBA1C 7.0 (A) 08/11/2021   HGBA1C 7.3 (A) 04/13/2021   HGBA1C 7.0 (A) 12/11/2020   HGBA1C 7.1 (A) 08/07/2020   HGBA1C 7.2 (A) 04/03/2020   HGBA1C 7.1 (A) 12/06/2019   HGBA1C 7.1 (A) 07/27/2019   HGBA1C 6.6 (A) 03/22/2019   HGBA1C 6.4 (A) 05/05/2018   HGBA1C 6.4 (A) 10/28/2017   HGBA1C 6.0 04/01/2017   HGBA1C 6.2 10/01/2016   HGBA1C 6.2 09/19/2015   HGBA1C 6.1 06/23/2015   Pt is on a regimen of: - Glyxambi (empagliflozin-linagliptin) 25-5 mg daily after lunch - Glipizide ER 2.5 mg daily before breakfast I suggested to start Rybelsus and switch to Jardiance only, but she preferred to continue Glyxambi. At last visit, we stopped glipizide XL 5 mg in a.m. >> she is feeling much better. She had nausea with Metformin before.  She is checking CBGs 0 to once a day: - am:  108-233 >> 90s-118 >>  115-130 >> 120-130, 161 >> 128-151, prev. 161-180 - 2h after b'fast: n/c >> 246 - before lunch: n/c >> 183 >> n/c - 2h after lunch: n/c >> 109 >> n/c >> 130-140s  >> n/c - before dinner: n/c - 2h after dinner: n/c - bedtime: 130-160 >> 189 >> 118, 120s, 200 >> n/c  - nighttime: n/c Lowest sugar was 108 >> 90s >> 120 >> 128; it is unclear at which level she has hypoglycemia awareness. Highest sugar was 233 >> 183 >> 161 >> 246 (after PB).  Glucometer: One Touch Verio  Pt's meals are: - Breakfast: 2 Eggos + fruit - Lunch: pita tuna; [employer]; yoghurt - Dinner: meat + veggies + spaghetti -  Snacks: dill pickles, nuts, chocolate, potato chips, grapes She is on nutrition in the past and this helped.  She started exercising at the gym in 1998, when [employer] opened: Body pump, Zumba, but stopped due to the coronavirus pandemic.  She restarted afterwards.  -No CKD, last BUN/creatinine:  Lab Results  Component Value Date   BUN 18 04/13/2021   CREATININE 1.06 (H) 04/13/2021  On olmesartan.  -+ HL; latest set of lipids: Lab Results  Component Value Date   CHOL 151 04/13/2021   HDL 50.50 04/13/2021   LDLCALC 62 04/13/2021   LDLDIRECT 122.0 04/01/2017   TRIG 190.0 (H) 04/13/2021   CHOLHDL 3 04/13/2021  We started Crestor 5 in 02/2019.  She takes this most days of the week, but not quite every day due to mental fog and fatigue.  - last eye exam was 01/12/2022: No DR reportedly.  Dr. Lawerance Bach in [facility].  - she denies numbness and tingling in her feet.  Last foot exam 06/10/2022.  She has a h/o diverticulitis >> part of colon removed in 2008.  Her husband is competing in bowling tournaments.    ROS: + see HPI  I reviewed pt's medications, allergies, PMH, social hx, family hx, and changes were documented in the history of present illness. Otherwise, unchanged from my initial visit note.  Past  Medical History:  Diagnosis Date   Diabetes mellitus without complication (HCC)    Diverticulitis    Hypertension    Past Surgical History:  Procedure Laterality Date   COLON SURGERY     History   Social History   Marital Status: Married    Spouse Name: N/A   Number of Children: 1   Occupational History      Social History Main Topics   Smoking status: Never Smoker    Smokeless tobacco: Not on file   Alcohol Use: No   Drug Use: No   Current Outpatient Medications on File Prior to Visit  Medication Sig Dispense Refill   glipiZIDE (GLUCOTROL XL) 2.5 MG 24 hr tablet Take 1 tablet (2.5 mg total) by mouth daily before breakfast. 90 tablet 3   clorazepate (TRANXENE)  7.5 MG tablet TAKE 1 TABLET BY MOUTH TWICE A DAY AS NEEDED FOR ANXIETY/SLEEP 60 tablet 0   Empagliflozin-linaGLIPtin (GLYXAMBI) 25-5 MG TABS Take 1 tablet by mouth daily. 90 tablet 1   fluticasone (FLONASE) 50 MCG/ACT nasal spray Place 2 sprays into both nostrils daily. 16 g 1   hydrochlorothiazide (HYDRODIURIL) 25 MG tablet Take 1 tablet (25 mg total) by mouth daily. 90 tablet 1   olmesartan (BENICAR) 40 MG tablet TAKE 1 TABLET BY MOUTH EVERY DAY 90 tablet 3   ONETOUCH DELICA LANCETS FINE MISC 1 Device by Does not apply route daily as needed. 50 each 3   ONETOUCH VERIO test strip USE AS DIRECTED 3 TIMES A DAY TO CHECK BLOOD SUGAR 300 strip 1   pantoprazole (PROTONIX) 40 MG tablet      rosuvastatin (CRESTOR) 5 MG tablet TAKE 1 TABLET BY MOUTH EVERY DAY 90 tablet 3   No current facility-administered medications on file prior to visit.   Allergies  Allergen Reactions   Cetirizine Hcl     REACTION: fatigue   Metformin And Related Nausea And Vomiting   Penicillins Nausea And Vomiting   Valsartan     REACTION: rash   Family History  Problem Relation Age of Onset   Diabetes Mother    Stroke Mother    Cancer Father        colon or liver  Thyroid disease and hypertension in mother  PE: BP 122/80   Pulse 93   Ht 5\' 4"  (1.626 m)   Wt 178 lb 12.8 oz (81.1 kg)   SpO2 98%   BMI 30.69 kg/m    Wt Readings from Last 3 Encounters:  11/11/22 178 lb 12.8 oz (81.1 kg)  06/10/22 176 lb (79.8 kg)  08/11/21 175 lb 6.4 oz (79.6 kg)   Constitutional: overweight, in NAD Eyes:  EOMI, no exophthalmos ENT: no neck masses, no cervical lymphadenopathy Cardiovascular: Tachycardia, RR, No MRG Respiratory: CTA B Musculoskeletal: no deformities Skin:no rashes Neurological: no tremor with outstretched hands  ASSESSMENT: 1. DM2, non-insulin-dependent, uncontrolled, without long term complications, but with hyperglycemia  2. HL  3. Overweight  PLAN:  1. Patient with history of uncontrolled type  2 diabetes, on oral antidiabetic regimen with DPP 4 inhibitor and SGLT2 inhibitor for a long time, which we tried to intensify at last visit as her HbA1c was higher, at 7.4%.  Sugars were higher in the morning, between 120s and 160s.  I recommended to add low-dose metformin ER with dinner, as she had nausea in the past with regular metformin.  However, afterwards, we had to make some changes in her regimen due to intolerance.  We tried Rybelsus,  Jardiance, but currently on glipizide ER low-dose. -At today's visit, sugars are mostly above target in the morning.  She is not checking later in the day, but did check 1 time after eating peanut butter in the morning and sugars increased to 246.  We discussed that we need more checks later in the day and if the sugars remain elevated, we may need to increase the glipizide dose.  She is taking Glyxambi after lunch and we discussed about moving this before breakfast.  I advised her to let me know if sugars remain elevated. -I suggested to:  Patient Instructions  Please move: - Glyxambi 25-5 mg daily before b'fast  Continue: - Glipizide ER 2.5 mg before b'fast  Check some sugars later in the day.  Please return in 3 months with your sugar log.  - we checked her HbA1c: 7.5% (slightly higher) - advised to check sugars at different times of the day - 1x a day, rotating check times - advised for yearly eye exams >> she is UTD - she is due for annual labs.  Will check these today. - return to clinic in 3 months  2. HL -Reviewed latest lipid panel from 03/2021: Triglycerides elevated but LDL at goal: Lab Results  Component Value Date   CHOL 151 04/13/2021   HDL 50.50 04/13/2021   LDLCALC 62 04/13/2021   LDLDIRECT 122.0 04/01/2017   TRIG 190.0 (H) 04/13/2021   CHOLHDL 3 04/13/2021  -She continues Crestor 5 mg every other day without side effects..  She had mental fog and fatigue when she was taking it daily. -Will recheck her lipid panel today  3.  Obesity class 1 -continue SGLT 2 inhibitor and GLP-1 receptor agonist which should also help with weight loss -Weight approximately stable at last 2 visits -She gained 2 pounds since last visit  She did not stop at the lab...  Carlus Pavlov, MD PhD [facility] Endocrinology

## 2022-11-20 ENCOUNTER — Other Ambulatory Visit: Payer: Self-pay | Admitting: Internal Medicine

## 2023-01-03 NOTE — Addendum Note (Signed)
Addended by: Pollie Meyer on: 01/03/2023 09:12 AM   Modules accepted: Orders

## 2023-02-17 ENCOUNTER — Encounter: Payer: Self-pay | Admitting: Internal Medicine

## 2023-02-17 ENCOUNTER — Ambulatory Visit (INDEPENDENT_AMBULATORY_CARE_PROVIDER_SITE_OTHER): Payer: Managed Care, Other (non HMO) | Admitting: Internal Medicine

## 2023-02-17 VITALS — BP 126/80 | HR 96 | Resp 18 | Ht 64.0 in | Wt 178.0 lb

## 2023-02-17 DIAGNOSIS — Z7984 Long term (current) use of oral hypoglycemic drugs: Secondary | ICD-10-CM | POA: Diagnosis not present

## 2023-02-17 DIAGNOSIS — E785 Hyperlipidemia, unspecified: Secondary | ICD-10-CM | POA: Diagnosis not present

## 2023-02-17 DIAGNOSIS — E669 Obesity, unspecified: Secondary | ICD-10-CM

## 2023-02-17 DIAGNOSIS — E119 Type 2 diabetes mellitus without complications: Secondary | ICD-10-CM

## 2023-02-17 DIAGNOSIS — E66811 Obesity, class 1: Secondary | ICD-10-CM

## 2023-02-17 LAB — COMPREHENSIVE METABOLIC PANEL
ALT: 44 U/L — ABNORMAL HIGH (ref 0–35)
AST: 32 U/L (ref 0–37)
Albumin: 4.2 g/dL (ref 3.5–5.2)
Alkaline Phosphatase: 60 U/L (ref 39–117)
BUN: 13 mg/dL (ref 6–23)
CO2: 30 mEq/L (ref 19–32)
Calcium: 9.4 mg/dL (ref 8.4–10.5)
Chloride: 104 mEq/L (ref 96–112)
Creatinine, Ser: 0.74 mg/dL (ref 0.40–1.20)
GFR: 86.75 mL/min (ref >60.00)
Glucose, Bld: 228 mg/dL — ABNORMAL HIGH (ref 70–99)
Potassium: 4.1 mEq/L (ref 3.5–5.1)
Sodium: 140 mEq/L (ref 135–145)
Total Bilirubin: 0.6 mg/dL (ref 0.2–1.2)
Total Protein: 7 g/dL (ref 6.0–8.3)

## 2023-02-17 LAB — LIPID PANEL
Cholesterol: 182 mg/dL (ref 0–200)
HDL: 51.7 mg/dL (ref >39.00)
LDL Cholesterol: 93 mg/dL (ref 0–99)
NonHDL: 129.93
Total CHOL/HDL Ratio: 4
Triglycerides: 184 mg/dL — ABNORMAL HIGH (ref 0.0–149.0)
VLDL: 36.8 mg/dL (ref 0.0–40.0)

## 2023-02-17 LAB — POCT GLYCOSYLATED HEMOGLOBIN (HGB A1C): Hemoglobin A1C: 7.3 % — AB (ref 4.0–5.6)

## 2023-02-17 LAB — MICROALBUMIN / CREATININE URINE RATIO
Creatinine,U: 51.4 mg/dL
Microalb Creat Ratio: 1.4 mg/g (ref 0.0–30.0)
Microalb, Ur: <0.7 mg/dL (ref 0.0–1.9)

## 2023-02-17 MED ORDER — GLYXAMBI 25-5 MG PO TABS: 1.0000 | ORAL_TABLET | Freq: Every day | ORAL | 3 refills | Status: DC

## 2023-02-17 NOTE — Patient Instructions (Addendum)
Please continue: - Glyxambi 25-5 mg daily before dinner - Glipizide ER 2.5 mg before b'fast  Check some sugars later in the day.  Please return in 6 months with your sugar log.

## 2023-02-18 ENCOUNTER — Telehealth: Payer: Self-pay

## 2023-02-18 NOTE — Telephone Encounter (Signed)
-----   Message from Carlus Pavlov sent at 02/18/2023  8:40 AM EDT ----- Can you please call pt.:  Elizabeth Ware, Elizabeth Ware.   LDL (bad) cholesterol is under 100, but 50% higher than before.  She definitely needs to improve diet.  Please advise her to try to decrease fatty Elizabeth fried foods Elizabeth increase fiber Elizabeth grains.

## 2023-02-18 NOTE — Telephone Encounter (Signed)
Patient results called to patient as well as MD advice concerning diet choices.

## 2023-02-23 ENCOUNTER — Other Ambulatory Visit: Payer: Self-pay | Admitting: Internal Medicine

## 2023-02-23 DIAGNOSIS — E119 Type 2 diabetes mellitus without complications: Secondary | ICD-10-CM

## 2023-02-23 MED ORDER — ONETOUCH VERIO VI STRP: ORAL_STRIP | 1 refills | Status: AC

## 2023-02-23 NOTE — Telephone Encounter (Signed)
One touch test strips refill request complete

## 2023-06-02 LAB — HM DIABETES EYE EXAM

## 2023-06-03 ENCOUNTER — Encounter: Payer: Self-pay | Admitting: Internal Medicine

## 2023-06-04 ENCOUNTER — Other Ambulatory Visit: Payer: Self-pay | Admitting: Internal Medicine

## 2023-07-30 ENCOUNTER — Other Ambulatory Visit: Payer: Self-pay | Admitting: Internal Medicine

## 2023-08-01 ENCOUNTER — Other Ambulatory Visit: Payer: Self-pay | Admitting: Internal Medicine

## 2023-08-17 ENCOUNTER — Ambulatory Visit: Payer: Managed Care, Other (non HMO) | Admitting: Internal Medicine

## 2023-09-06 ENCOUNTER — Ambulatory Visit (INDEPENDENT_AMBULATORY_CARE_PROVIDER_SITE_OTHER): Admitting: Internal Medicine

## 2023-09-06 ENCOUNTER — Encounter: Payer: Self-pay | Admitting: Internal Medicine

## 2023-09-06 VITALS — BP 120/72 | HR 85 | Ht 64.0 in | Wt 174.8 lb

## 2023-09-06 DIAGNOSIS — E66811 Obesity, class 1: Secondary | ICD-10-CM

## 2023-09-06 DIAGNOSIS — E119 Type 2 diabetes mellitus without complications: Secondary | ICD-10-CM | POA: Diagnosis not present

## 2023-09-06 DIAGNOSIS — E785 Hyperlipidemia, unspecified: Secondary | ICD-10-CM | POA: Diagnosis not present

## 2023-09-06 DIAGNOSIS — Z7984 Long term (current) use of oral hypoglycemic drugs: Secondary | ICD-10-CM | POA: Diagnosis not present

## 2023-09-06 LAB — POCT GLYCOSYLATED HEMOGLOBIN (HGB A1C): Hemoglobin A1C: 7.9 % — AB (ref 4.0–5.6)

## 2023-09-06 LAB — MICROALBUMIN / CREATININE URINE RATIO
Creatinine, Urine: 15 mg/dL — ABNORMAL LOW (ref 20–275)
Microalb Creat Ratio: 20 mg/g{creat} (ref <30)
Microalb, Ur: 0.3 mg/dL

## 2023-09-06 NOTE — Patient Instructions (Addendum)
 Please continue: - Glyxambi 25-5 mg daily before dinner - Glipizide ER 2.5 mg before b'fast  Check some sugars later in the day.  Please return in 3-4 months with your sugar log.

## 2023-09-06 NOTE — Progress Notes (Signed)
 Patient ID: Elizabeth Ware, female   DOB: 1961-02-18, 63 y.o.   MRN: [id number]  HPI: Elizabeth Ware is a 63 y.o.-year-old female, presenting for f/u for DM2, prev GDM, dx in 05/2014, non-insulin-dependent, uncontrolled, without complications. Last visit 7 months ago.  Interim history: She retired before last visit. She continues to go to the gym consistently. No increased urination, blurry vision, nausea, chest pain.  Reviewed HbA1c levels: Lab Results  Component Value Date   HGBA1C 7.3 (A) 02/17/2023   HGBA1C 7.5 (A) 11/11/2022   HGBA1C 7.4 (A) 06/10/2022   HGBA1C 7.0 (A) 08/11/2021   HGBA1C 7.3 (A) 04/13/2021   HGBA1C 7.0 (A) 12/11/2020   HGBA1C 7.1 (A) 08/07/2020   HGBA1C 7.2 (A) 04/03/2020   HGBA1C 7.1 (A) 12/06/2019   HGBA1C 7.1 (A) 07/27/2019   HGBA1C 6.6 (A) 03/22/2019   HGBA1C 6.4 (A) 05/05/2018   HGBA1C 6.4 (A) 10/28/2017   HGBA1C 6.0 04/01/2017   HGBA1C 6.2 10/01/2016   Pt is on a regimen of: - Glyxambi (empagliflozin-linagliptin) 25-5 mg daily after lunch >> not moved before breakfast >> now before dinner - Glipizide ER 2.5 mg daily before breakfast I suggested to start Rybelsus and switch to Jardiance only, but she preferred to continue Glyxambi. At last visit, we stopped glipizide XL 5 mg in a.m. >> she is feeling much better. She had nausea with Metformin before.  She previously was only checking sugars in the morning.  Now checking very sporadically. - am:  128-151, prev. 161-180 >> 124-160 >> 110-120  - 2h after b'fast: n/c >> 246 >> n/c - before lunch: n/c >> 183 >> n/c - 2h after lunch: n/c >> 109 >> n/c >> 130-140s  >> n/c - before dinner: n/c - 2h after dinner: n/c - bedtime: 130-160 >> 189 >> 118, 120s, 200 >> n/c  - nighttime: n/c Lowest sugar was 128 >> 124 >> hypo sensation (did not check CBG) 1-2x a mo; it is unclear at which level she has hypoglycemia awareness. Highest sugar was 246 (after PB) >> 160 >> 120.  Glucometer: One Touch Verio  Pt's  meals are: - Breakfast: 2 Eggos + fruit >> PB + 2 Eggos - Lunch: pita tuna; [employer]; yoghurt - Dinner: meat + veggies + spaghetti - Snacks: dill pickles, nuts, chocolate, potato chips, grapes She is on nutrition in the past and this helped.  She started exercising at the gym in 1998, when [employer] opened: Body pump, Zumba, but stopped due to the coronavirus pandemic.  She restarted afterwards.  -No CKD, last BUN/creatinine:  Lab Results  Component Value Date   BUN 13 02/17/2023   CREATININE 0.74 02/17/2023   Lab Results  Component Value Date   MICRALBCREAT 1.4 02/17/2023   MICRALBCREAT 1.6 04/13/2021   MICRALBCREAT 1.6 03/22/2019   MICRALBCREAT 2.4 04/01/2017  On olmesartan.  -+ HL; latest set of lipids: Lab Results  Component Value Date   CHOL 182 02/17/2023   HDL 51.70 02/17/2023   LDLCALC 93 02/17/2023   LDLDIRECT 122.0 04/01/2017   TRIG 184.0 (H) 02/17/2023   CHOLHDL 4 02/17/2023  We started Crestor 5 in 02/2019.  She takes this most days of the week, but not quite every day due to mental fog and fatigue.  - last eye exam was 06/02/2023: No DR.  Dr. Donnette Ware in [facility].  - she denies numbness and tingling in her feet.  Last foot exam 06/10/2022. Saw Dr. Celia Ware in 2022.  She has a h/o  diverticulitis >> part of colon removed in 2008.  Her husband is competing in bowling tournaments.    ROS: + see HPI  I reviewed pt's medications, allergies, PMH, social hx, family hx, and changes were documented in the history of present illness. Otherwise, unchanged from my initial visit note.  Past Medical History:  Diagnosis Date   Diabetes mellitus without complication (HCC)    Diverticulitis    Hypertension    Past Surgical History:  Procedure Laterality Date   COLON SURGERY     History   Social History   Marital Status: Married    Spouse Name: N/A   Number of Children: 1   Occupational History      Social History Main Topics   Smoking status: Never Smoker     Smokeless tobacco: Not on file   Alcohol Use: No   Drug Use: No   Current Outpatient Medications on File Prior to Visit  Medication Sig Dispense Refill   clorazepate (TRANXENE) 7.5 MG tablet TAKE 1 TABLET BY MOUTH TWICE A DAY AS NEEDED FOR ANXIETY/SLEEP 60 tablet 0   Empagliflozin-linaGLIPtin (GLYXAMBI) 25-5 MG TABS Take 1 tablet by mouth daily. 90 tablet 3   fluticasone (FLONASE) 50 MCG/ACT nasal spray Place 2 sprays into both nostrils daily. 16 g 1   glipiZIDE (GLUCOTROL XL) 2.5 MG 24 hr tablet TAKE 1 TABLET (2.5 MG TOTAL) BY MOUTH DAILY BEFORE BREAKFAST. 90 tablet 3   glucose blood (ONETOUCH VERIO) test strip Use as instructed 300 strip 1   hydrochlorothiazide (HYDRODIURIL) 25 MG tablet TAKE 1 TABLET (25 MG TOTAL) BY MOUTH DAILY. 90 tablet 1   olmesartan (BENICAR) 40 MG tablet TAKE 1 TABLET BY MOUTH EVERY DAY 90 tablet 3   ONETOUCH DELICA LANCETS FINE MISC 1 Device by Does not apply route daily as needed. 50 each 3   pantoprazole (PROTONIX) 40 MG tablet      rosuvastatin (CRESTOR) 5 MG tablet TAKE 1 TABLET BY MOUTH EVERY DAY 90 tablet 3   No current facility-administered medications on file prior to visit.   Allergies  Allergen Reactions   Cetirizine Hcl     REACTION: fatigue   Metformin And Related Nausea And Vomiting   Penicillins Nausea And Vomiting   Valsartan     REACTION: rash   Family History  Problem Relation Age of Onset   Diabetes Mother    Stroke Mother    Cancer Father        colon or liver  Thyroid disease and hypertension in mother  PE: BP 120/72   Pulse 85   Ht 5\' 4"  (1.626 m)   Wt 174 lb 12.8 oz (79.3 kg)   SpO2 95%   BMI 30.00 kg/m    Wt Readings from Last 3 Encounters:  09/06/23 174 lb 12.8 oz (79.3 kg)  02/17/23 178 lb (80.7 kg)  11/11/22 178 lb 12.8 oz (81.1 kg)   Constitutional: overweight, in NAD Eyes:  EOMI, no exophthalmos ENT: no neck masses, no cervical lymphadenopathy Cardiovascular: RRR, No MRG Respiratory: CTA B Musculoskeletal:  no deformities Skin:no rashes Neurological: no tremor with outstretched hands  ASSESSMENT: 1. DM2, non-insulin-dependent, uncontrolled, without long term complications, but with hyperglycemia  2. HL  3. Overweight  PLAN:  1. Patient with history of uncontrolled type 2 diabetes, on oral antidiabetic regimen with DPP 4 inhibitor and SGLT2 inhibitor along with low-dose sulfonylurea.  We did try to switch to low-dose metformin ER with dinner as she had nausea in the past  with regular metformin.  We also tried Rybelsus, Jardiance, but she could not tolerate these.  She prefers a regimen that combines empagliflozin with linagliptin along with glipizide ER. -At last visit, she was taking glipizide in the morning but Glyxambi later in the day, mostly before dinner.  She felt that taking Glyxambi along with glipizide before breakfast did not make her feel well (feeling "off").  Unfortunately, we have not been successful in starting other therapies for her.  She was checking sugars in the morning and these were above target.  I again advised her to check some sugars later in the day, also.  Since she was very resistant to change the regimen, we continued it especially as HbA1c was slightly lower, at 7.3%.  I did recommend changing diet. -At today's visit, she is not checking sugars almost at all.  When checked, they appear to be at goal in the morning.  HbA1c at today's visit is the highest she had in years.  We discussed that without her checking blood sugars it is impossible to make any adjustments in her regimen.  She agrees to start checking and come back in another 3 to 4 months. -I suggested to:  Patient Instructions  Please continue: - Glyxambi 25-5 mg daily before dinner - Glipizide ER 2.5 mg before b'fast  Check some sugars later in the day.  Please return in 3 months with your sugar log.  - we checked her HbA1c: 7.9% (higher) - advised to check sugars at different times of the day - 1x a  day, rotating check times - advised for yearly eye exams >> she is UTD - return to clinic in 3 months  2. HL - Latest lipid panel was reviewed from 01/2023: LDL at goal, triglycerides slightly elevated: Lab Results  Component Value Date   CHOL 182 02/17/2023   HDL 51.70 02/17/2023   LDLCALC 93 02/17/2023   LDLDIRECT 122.0 04/01/2017   TRIG 184.0 (H) 02/17/2023   CHOLHDL 4 02/17/2023  - She continues Crestor 5 mg every other day-she cannot take it daily due to mental fog and fatigue  3. Obesity class 1 - She can use on an SGLT2 inhibitor which should also help with weight loss - Weight was approximately stable in the last 3 visits - she lost 4 pounds since last visit  Elizabeth Harden, MD PhD [facility] Endocrinology

## 2023-10-10 ENCOUNTER — Other Ambulatory Visit: Payer: Self-pay | Admitting: Internal Medicine

## 2023-10-10 DIAGNOSIS — Z1231 Encounter for screening mammogram for malignant neoplasm of breast: Secondary | ICD-10-CM

## 2023-10-20 ENCOUNTER — Ambulatory Visit
Admission: RE | Admit: 2023-10-20 | Discharge: 2023-10-20 | Disposition: A | Discharge status: Home / Self Care | Source: Ambulatory Visit | Attending: Internal Medicine | Admitting: Internal Medicine

## 2023-10-20 DIAGNOSIS — Z1231 Encounter for screening mammogram for malignant neoplasm of breast: Secondary | ICD-10-CM

## 2023-10-25 ENCOUNTER — Other Ambulatory Visit: Payer: Self-pay | Admitting: Internal Medicine

## 2023-10-25 DIAGNOSIS — R928 Other abnormal and inconclusive findings on diagnostic imaging of breast: Secondary | ICD-10-CM

## 2023-11-01 ENCOUNTER — Other Ambulatory Visit: Payer: Self-pay | Admitting: Internal Medicine

## 2023-11-01 DIAGNOSIS — R928 Other abnormal and inconclusive findings on diagnostic imaging of breast: Secondary | ICD-10-CM

## 2023-11-02 ENCOUNTER — Telehealth: Payer: Self-pay | Admitting: Internal Medicine

## 2023-11-02 NOTE — Telephone Encounter (Signed)
 Copied from CRM #[id number]. Topic: General - Other >> Nov 02, 2023  3:36 PM Earnestine Goes B wrote: Reason for CRM: breast center called regarding mamogram order for pt. Requesting the provider sign off on the orders as soon a possible in epic. States pt has a abnormal breast imagine

## 2023-11-03 ENCOUNTER — Other Ambulatory Visit: Payer: Self-pay | Admitting: Internal Medicine

## 2023-11-03 ENCOUNTER — Ambulatory Visit
Admission: RE | Admit: 2023-11-03 | Discharge: 2023-11-03 | Disposition: A | Discharge status: Home / Self Care | Source: Ambulatory Visit | Attending: Internal Medicine | Admitting: Internal Medicine

## 2023-11-03 DIAGNOSIS — N6310 Unspecified lump in the right breast, unspecified quadrant: Secondary | ICD-10-CM

## 2023-11-03 DIAGNOSIS — R928 Other abnormal and inconclusive findings on diagnostic imaging of breast: Secondary | ICD-10-CM

## 2023-11-04 NOTE — Telephone Encounter (Signed)
 I think it has been done.  Thanks

## 2023-11-08 ENCOUNTER — Other Ambulatory Visit: Payer: Self-pay | Admitting: Internal Medicine

## 2023-11-08 DIAGNOSIS — N6310 Unspecified lump in the right breast, unspecified quadrant: Secondary | ICD-10-CM

## 2023-12-19 ENCOUNTER — Other Ambulatory Visit: Payer: Self-pay

## 2023-12-19 ENCOUNTER — Telehealth: Payer: Self-pay | Admitting: Internal Medicine

## 2023-12-19 DIAGNOSIS — E119 Type 2 diabetes mellitus without complications: Secondary | ICD-10-CM

## 2023-12-19 MED ORDER — HYDROCHLOROTHIAZIDE 25 MG PO TABS: 25.0000 mg | ORAL_TABLET | Freq: Every day | ORAL | 1 refills | Status: AC

## 2023-12-19 NOTE — Telephone Encounter (Signed)
 Medication requested is used for sleep and anxiety. Patient called and made aware she needs to reach out to her PCP to get this refilled as this is not something DR.Trixie is treating. Patient verbalized an understanding. No further questions at this time.

## 2023-12-19 NOTE — Telephone Encounter (Signed)
 Patient called this afternoon,stating she is needing a 90 day refill on her clorazepate  (TRANXENE ) 7.5 MG tablet.Patient has an appointment scheduled on 01/11/24 with Dr.Gherghe.Please send RX to: [id number] [address] [facility], [place] - [address], [place] [address] Phone: [phone]  Fax: [phone]   Patient's contact info is [phone]

## 2024-01-11 ENCOUNTER — Ambulatory Visit (INDEPENDENT_AMBULATORY_CARE_PROVIDER_SITE_OTHER): Admitting: Internal Medicine

## 2024-01-11 ENCOUNTER — Encounter: Payer: Self-pay | Admitting: Internal Medicine

## 2024-01-11 VITALS — BP 120/70 | HR 95 | Ht 64.0 in | Wt 173.0 lb

## 2024-01-11 DIAGNOSIS — E663 Overweight: Secondary | ICD-10-CM

## 2024-01-11 DIAGNOSIS — E785 Hyperlipidemia, unspecified: Secondary | ICD-10-CM

## 2024-01-11 DIAGNOSIS — Z7984 Long term (current) use of oral hypoglycemic drugs: Secondary | ICD-10-CM

## 2024-01-11 DIAGNOSIS — E119 Type 2 diabetes mellitus without complications: Secondary | ICD-10-CM | POA: Diagnosis not present

## 2024-01-11 LAB — POCT GLYCOSYLATED HEMOGLOBIN (HGB A1C): Hemoglobin A1C: 7.5 % — AB (ref 4.0–5.6)

## 2024-01-11 MED ORDER — GLYXAMBI 25-5 MG PO TABS: 1.0000 | ORAL_TABLET | Freq: Every day | ORAL | 3 refills | Status: AC

## 2024-01-11 NOTE — Progress Notes (Signed)
 Patient ID: Elizabeth Ware, female   DOB: 03/31/1961, 63 y.o.   MRN: [id number]  HPI: Elizabeth Ware is a 63 y.o.-year-old female, presenting for f/u for DM2, prev GDM, dx in 05/2014, non-insulin-dependent, uncontrolled, without complications. Last visit 4 months ago.  Interim history: She retired before last visit. She continues to go to the gym consistently -but she is going 4-5x a week now, increased from last OV. No increased urination, blurry vision, nausea, chest pain.  Reviewed HbA1c levels: Lab Results  Component Value Date   HGBA1C 7.9 (A) 09/06/2023   HGBA1C 7.3 (A) 02/17/2023   HGBA1C 7.5 (A) 11/11/2022   HGBA1C 7.4 (A) 06/10/2022   HGBA1C 7.0 (A) 08/11/2021   HGBA1C 7.3 (A) 04/13/2021   HGBA1C 7.0 (A) 12/11/2020   HGBA1C 7.1 (A) 08/07/2020   HGBA1C 7.2 (A) 04/03/2020   HGBA1C 7.1 (A) 12/06/2019   HGBA1C 7.1 (A) 07/27/2019   HGBA1C 6.6 (A) 03/22/2019   HGBA1C 6.4 (A) 05/05/2018   HGBA1C 6.4 (A) 10/28/2017   HGBA1C 6.0 04/01/2017   Pt is on a regimen of: - Glyxambi  (empagliflozin -linagliptin ) 25-5 mg daily after lunch >> not moved before breakfast >> now before dinner - Glipizide  ER 2.5 mg daily before breakfast I suggested to start Rybelsus  and switch to Jardiance  only, but she preferred to continue Glyxambi . At last visit, we stopped glipizide  XL 5 mg in a.m. >> she is feeling much better. She had nausea with Metformin  before.  She is checking sugars sporadically: - am:  128-151, prev. 161-180 >> 124-160 >> 110-120 >> 102-133, 148 (102-122 in last month) - 2h after b'fast: n/c >> 246 >> n/c - before lunch: n/c >> 183 >> n/c - 2h after lunch: n/c >> 109 >> n/c >> 130-140s  >> n/c - before dinner: n/c - 2h after dinner: n/c - bedtime: 130-160 >> 189 >> 118, 120s, 200 >> n/c  - nighttime: n/c Lowest sugar was 128 >> 124 >> 102; it is unclear at which level she has hypoglycemia awareness. Highest sugar was 246 (after PB) >> 160 >> 120 >> 148.  Glucometer: One Touch  Verio  Pt's meals are: - Breakfast: 2 Eggos + fruit >> PB + 2 Eggos - Lunch: pita tuna; [employer]; yoghurt - Dinner: meat + veggies + spaghetti - Snacks: dill pickles, nuts, chocolate, potato chips, grapes She is on nutrition in the past and this helped.  She started exercising at the gym in 1998, when [employer] opened: Body pump, Zumba, but stopped due to the coronavirus pandemic.  She restarted afterwards.  -No CKD, last BUN/creatinine:  Lab Results  Component Value Date   BUN 13 02/17/2023   CREATININE 0.74 02/17/2023   Lab Results  Component Value Date   MICRALBCREAT 20 09/06/2023  On olmesartan .  -+ HL; latest set of lipids: Lab Results  Component Value Date   CHOL 182 02/17/2023   HDL 51.70 02/17/2023   LDLCALC 93 02/17/2023   LDLDIRECT 122.0 04/01/2017   TRIG 184.0 (H) 02/17/2023   CHOLHDL 4 02/17/2023  We started Crestor  5 in 02/2019.  She takes this most days of the week, but not quite every day due to mental fog and fatigue.  - last eye exam was 06/02/2023: No DR.  Dr. Geofm in [facility].  - she denies numbness and tingling in her feet.  Last foot exam 09/06/2023 here in clinic.  She previously saw Dr. Magdalen, but not recently.  She has a h/o diverticulitis >> part of  colon removed in 2008.  Her husband is competing in bowling tournaments.    ROS: + see HPI  I reviewed pt's medications, allergies, PMH, social hx, family hx, and changes were documented in the history of present illness. Otherwise, unchanged from my initial visit note.  Past Medical History:  Diagnosis Date   Diabetes mellitus without complication (HCC)    Diverticulitis    Hypertension    Past Surgical History:  Procedure Laterality Date   COLON SURGERY     History   Social History   Marital Status: Married    Spouse Name: N/A   Number of Children: 1   Occupational History      Social History Main Topics   Smoking status: Never Smoker    Smokeless tobacco: Not on file    Alcohol Use: No   Drug Use: No   Current Outpatient Medications on File Prior to Visit  Medication Sig Dispense Refill   clorazepate  (TRANXENE ) 7.5 MG tablet TAKE 1 TABLET BY MOUTH TWICE A DAY AS NEEDED FOR ANXIETY/SLEEP 60 tablet 0   Empagliflozin -linaGLIPtin  (GLYXAMBI ) 25-5 MG TABS Take 1 tablet by mouth daily. 90 tablet 3   fluticasone  (FLONASE ) 50 MCG/ACT nasal spray Place 2 sprays into both nostrils daily. 16 g 1   glipiZIDE  (GLUCOTROL  XL) 2.5 MG 24 hr tablet TAKE 1 TABLET (2.5 MG TOTAL) BY MOUTH DAILY BEFORE BREAKFAST. 90 tablet 3   glucose blood (ONETOUCH VERIO) test strip Use as instructed 300 strip 1   hydrochlorothiazide  (HYDRODIURIL ) 25 MG tablet Take 1 tablet (25 mg total) by mouth daily. 90 tablet 1   olmesartan  (BENICAR ) 40 MG tablet TAKE 1 TABLET BY MOUTH EVERY DAY 90 tablet 3   ONETOUCH DELICA LANCETS FINE MISC 1 Device by Does not apply route daily as needed. 50 each 3   pantoprazole (PROTONIX) 40 MG tablet      rosuvastatin  (CRESTOR ) 5 MG tablet TAKE 1 TABLET BY MOUTH EVERY DAY 90 tablet 3   No current facility-administered medications on file prior to visit.   Allergies  Allergen Reactions   Cetirizine Hcl     REACTION: fatigue   Metformin  And Related Nausea And Vomiting   Penicillins Nausea And Vomiting   Valsartan      REACTION: rash   Family History  Problem Relation Age of Onset   Diabetes Mother    Stroke Mother    Cancer Father        colon or liver  Thyroid  disease and hypertension in mother  PE: BP 120/70 (BP Location: Left Arm, Patient Position: Sitting, Cuff Size: Normal)   Pulse 95   Ht 5' 4 (1.626 m)   Wt 173 lb (78.5 kg)   SpO2 97%   BMI 29.70 kg/m    Wt Readings from Last 3 Encounters:  01/11/24 173 lb (78.5 kg)  09/06/23 174 lb 12.8 oz (79.3 kg)  02/17/23 178 lb (80.7 kg)   Constitutional: overweight, in NAD Eyes:  EOMI, no exophthalmos ENT: no neck masses, no cervical lymphadenopathy Cardiovascular: Tachycardia, RR, No  MRG Respiratory: CTA B Musculoskeletal: no deformities Skin:no rashes Neurological: no tremor with outstretched hands  ASSESSMENT: 1. DM2, non-insulin-dependent, uncontrolled, without long term complications, but with hyperglycemia  2. HL  3. Overweight  PLAN:  1. Patient with history of uncontrolled type 2 diabetes, on oral antidiabetic regimen with DPP 4 inhibitor, SGLT2 inhibitor along with low-dose sulfonylurea.  We tried to switch to low-dose metformin  ER with dinner in the past but she had  nausea with it. - We also tried Rybelsus , Jardiance , but could not tolerate these.  She prefers a regimen that combines empagliflozin  with linagliptin  along with glipizide  ER.  She was not able to take glipizide  along with Glyxambi  in the morning as this made her feel off so at last visit she was taking glipizide  in the morning and Glyxambi  before dinner.  She was quite resistant to change her regimen.  Unfortunately, she was also not checking her blood sugars almost at all and when checked, they were at goal in the morning.  HbA1c was the highest that she had in several years, at 7.9%.  We discussed that without more blood sugars, it was very difficult to make changes in her regimen.  I advised her to start checking sugars at different times of the day and return in 3 to 4 months. - At today's visit, she is still not checking her sugars later in the day, only in the morning.  After increasing the frequency of her exercise from 2-3 times a week to 4-5 times a week, sugars improved and this is visible in the last month.  However, unfortunately she only continues checking in the morning now, and the sugars are mostly at goal.  Without sugars later in the day, we cannot fine-tune her regimen.  Since her HbA1c is better (see below), we will continue the same regimen. -I suggested to:  Patient Instructions  Please continue: - Glyxambi  25-5 mg daily before dinner - Glipizide  ER 2.5 mg before b'fast  Check  some sugars later in the day.  Please return in 4-6 months with your sugar log.  - we checked her HbA1c: 7.5% (lower) - advised to check sugars at different times of the day - 1x a day, rotating check times - advised for yearly eye exams >> she is UTD - return to clinic in 4-6 months  2. HL - Latest lipid panel was reviewed from 01/2023: LDL at goal, triglycerides slightly elevated: Lab Results  Component Value Date   CHOL 182 02/17/2023   HDL 51.70 02/17/2023   LDLCALC 93 02/17/2023   LDLDIRECT 122.0 04/01/2017   TRIG 184.0 (H) 02/17/2023   CHOLHDL 4 02/17/2023  - She is on Crestor  5 mg every other day.  She cannot take it daily due to mental fog and fatigue  3. Overweight - she is on an SGLT2 inhibitor which should also help with weight loss - She lost 4 pounds before last visit and 1 pound since then  Lela Fendt, MD PhD [facility] Endocrinology

## 2024-01-11 NOTE — Patient Instructions (Addendum)
 Please continue: - Glyxambi  25-5 mg daily before dinner - Glipizide  ER 2.5 mg before b'fast  Check some sugars later in the day.  Please return in 4-6 months with your sugar log.

## 2024-01-11 NOTE — Addendum Note (Signed)
 Addended by: OCTAVIO DIETRICH CROME on: 01/11/2024 11:07 AM   Modules accepted: Orders

## 2024-02-22 ENCOUNTER — Telehealth: Payer: Self-pay

## 2024-02-22 ENCOUNTER — Ambulatory Visit: Payer: Self-pay

## 2024-02-22 NOTE — Telephone Encounter (Signed)
 Patient called back to get appointment

## 2024-02-22 NOTE — Telephone Encounter (Signed)
 Please contact patient to schedule appointment, Left vm saying she wants to schedule. She has appointment in February but may want to be seen sooner

## 2024-02-22 NOTE — Telephone Encounter (Signed)
 Patient has an UTI and would like something prescribed without needing to go into the office if possible   1st attempt to contact patient-no answer. Voicemail left to call back to Nurse Triage.

## 2024-02-22 NOTE — Telephone Encounter (Signed)
 Answer Assessment - Initial Assessment Questions This RN called patient and attempted to triage patient. Patient declined triage and reports I am feeling much better and I'm not having any more back pain.   Advised UC/ED if symptoms occur again.  Protocols used: Information Only Call - No Triage-A-AH

## 2024-02-22 NOTE — Telephone Encounter (Signed)
 LMx1 to call office to possibly schedule sooner appointment than February 2026.

## 2024-02-24 ENCOUNTER — Ambulatory Visit: Payer: Self-pay

## 2024-02-24 NOTE — Telephone Encounter (Signed)
 FYI Only or Action Required?: FYI only for provider.  Patient was last seen in primary care on N/A.  Called Nurse Triage reporting No chief complaint on file..  Symptoms began several days ago.  Interventions attempted: OTC medications: advil.  Symptoms are: stable.  Triage Disposition: No disposition on file.  Patient/caregiver understands and will follow disposition?:   Copied from CRM [id number]. Topic: Clinical - Red Word Triage >> Feb 24, 2024  9:39 AM Tiffini S wrote: [facility] that prompted transfer to Nurse Triage: Cyst on back area that is very painful Reason for Disposition  [1] Small swelling or lump AND [2] unexplained AND [3] present < 1 week  Answer Assessment - Initial Assessment Questions Pt states a few days ago she was lifting weights and thinks she pulled a muscle began having back pain. Then the ache went away and now pt has lump to her back that she believes is a cyst. Pain has lessened from 8/10-2/10  1. APPEARANCE of SWELLING: What does it look like?     Unsure of appearance,  2. SIZE: How large is the swelling? (e.g., inches, cm; or compare to size of pinhead, tip of pen, eraser, coin, pea, grape, ping pong ball)      Half an egg 3. LOCATION: Where is the swelling located?     R side of back 4. ONSET: When did the swelling start?     A few days ago 5. COLOR: What color is it? Is there more than one color?     unsure 6. PAIN: Is there any pain? If Yes, ask: How bad is the pain? (Scale 1-10; or mild, moderate, severe)       2/10 7. ITCH: Does it itch? If Yes, ask: How bad is the itch?      denies 8. CAUSE: What do you think caused the swelling?     cyst 9 OTHER SYMPTOMS: Do you have any other symptoms? (e.g., fever)     denies  Protocols used: Skin Lump or Localized Swelling-A-AH

## 2024-02-24 NOTE — Telephone Encounter (Signed)
 LMx2 for patient to call office to possibly schedule follow  before 06/2024.

## 2024-02-27 NOTE — Telephone Encounter (Signed)
 Patient is scheduled for 03/15/2024 at 3:20 PM.

## 2024-03-15 ENCOUNTER — Encounter: Payer: Self-pay | Admitting: Internal Medicine

## 2024-03-15 ENCOUNTER — Ambulatory Visit: Admitting: Internal Medicine

## 2024-03-15 VITALS — BP 122/70 | HR 88 | Ht 64.0 in | Wt 172.0 lb

## 2024-03-15 DIAGNOSIS — E119 Type 2 diabetes mellitus without complications: Secondary | ICD-10-CM

## 2024-03-15 DIAGNOSIS — E785 Hyperlipidemia, unspecified: Secondary | ICD-10-CM | POA: Diagnosis not present

## 2024-03-15 DIAGNOSIS — Z7984 Long term (current) use of oral hypoglycemic drugs: Secondary | ICD-10-CM

## 2024-03-15 DIAGNOSIS — E663 Overweight: Secondary | ICD-10-CM

## 2024-03-15 NOTE — Patient Instructions (Signed)
 Please continue: - Glyxambi  25-5 mg daily before dinner - Glipizide  ER 2.5 mg before b'fast  Check some sugars later in the day.  Please return in 4-6 months with your sugar log.

## 2024-03-15 NOTE — Progress Notes (Signed)
 Patient ID: Elizabeth Ware, female   DOB: 08/25/1960, 63 y.o.   MRN: [id number]  HPI: Elizabeth Ware is a 62 y.o.-year-old female, presenting for f/u for DM2, prev GDM, dx in 05/2014, non-insulin-dependent, uncontrolled, without complications. Last visit 4 months ago.  Interim history: She continues to go to the gym consistently 3-5x a week. Also walking her dog. No blurry vision, nausea, chest pain.  Approximately 3 weeks ago, she had significant low back pain along with a urinary tract infection.  She scheduled this appointment at that time, but the infection resolved and she does not have symptoms anymore.  Reviewed HbA1c levels: Lab Results  Component Value Date   HGBA1C 7.5 (A) 01/11/2024   HGBA1C 7.9 (A) 09/06/2023   HGBA1C 7.3 (A) 02/17/2023   HGBA1C 7.5 (A) 11/11/2022   HGBA1C 7.4 (A) 06/10/2022   HGBA1C 7.0 (A) 08/11/2021   HGBA1C 7.3 (A) 04/13/2021   HGBA1C 7.0 (A) 12/11/2020   HGBA1C 7.1 (A) 08/07/2020   HGBA1C 7.2 (A) 04/03/2020   HGBA1C 7.1 (A) 12/06/2019   HGBA1C 7.1 (A) 07/27/2019   HGBA1C 6.6 (A) 03/22/2019   HGBA1C 6.4 (A) 05/05/2018   HGBA1C 6.4 (A) 10/28/2017   Pt is on a regimen of: - Glyxambi  (empagliflozin -linagliptin ) 25-5 mg daily after lunch >> not moved before breakfast >> now before dinner - Glipizide  ER 2.5 mg daily before breakfast I suggested to start Rybelsus  and switch to Jardiance  only, but she preferred to continue Glyxambi . At last visit, we stopped glipizide  XL 5 mg in a.m. >> she is feeling much better. She had nausea with Metformin  before.  She is checking sugars sporadically: - am:  124-160 >> 110-120 >> 102-133, 148 (102-122 in last month) >> 110-140 - 2h after b'fast: n/c >> 246 >> n/c - before lunch: n/c >> 183 >> n/c - 2h after lunch: n/c >> 109 >> n/c >> 130-140s  >> n/c - before dinner: n/c - 2h after dinner: n/c - bedtime: 130-160 >> 189 >> 118, 120s, 200 >> n/c  - nighttime: n/c Lowest sugar was 128 >> 124 >> 102 >> 110; it is  unclear at which level she has hypoglycemia awareness. Highest sugar was 246 (after PB) >> 160 >> 120 >> 148 >> 140.  Glucometer: One Touch Verio  Pt's meals are: - Breakfast: 2 Eggos + fruit >> PB + 2 Eggos - Lunch: pita tuna; [employer]; yoghurt - Dinner: meat + veggies + spaghetti - Snacks: dill pickles, nuts, chocolate, potato chips, grapes She saw nutrition in the past and this helped.  She started exercising at the gym in 1998, when [employer] opened: Body pump, Zumba, but stopped due to the coronavirus pandemic.  She restarted afterwards.  -No CKD, last BUN/creatinine:  Lab Results  Component Value Date   BUN 13 02/17/2023   CREATININE 0.74 02/17/2023   Lab Results  Component Value Date   MICRALBCREAT 20 09/06/2023  On olmesartan .  -+ HL; latest set of lipids: Lab Results  Component Value Date   CHOL 182 02/17/2023   HDL 51.70 02/17/2023   LDLCALC 93 02/17/2023   LDLDIRECT 122.0 04/01/2017   TRIG 184.0 (H) 02/17/2023   CHOLHDL 4 02/17/2023  We started Crestor  5 in 02/2019.  She takes this most days of the week, but not quite every day due to mental fog and fatigue.  - last eye exam was 06/02/2023: No DR.  Dr. Geofm in [place] [facility].  - she denies numbness and tingling in her feet.  Last foot exam 09/06/2023 here in clinic.  She previously saw Dr. Magdalen, but not recently.  She has a h/o diverticulitis >> part of colon removed in 2008.  Her husband is competing in bowling tournaments.    ROS: + see HPI  I reviewed pt's medications, allergies, PMH, social hx, family hx, and changes were documented in the history of present illness. Otherwise, unchanged from my initial visit note.  Past Medical History:  Diagnosis Date   Diabetes mellitus without complication (HCC)    Diverticulitis    Hypertension    Past Surgical History:  Procedure Laterality Date   COLON SURGERY     History   Social History   Marital Status: Married    Spouse Name: N/A   Number of  Children: 1   Occupational History      Social History Main Topics   Smoking status: Never Smoker    Smokeless tobacco: Not on file   Alcohol Use: No   Drug Use: No   Current Outpatient Medications on File Prior to Visit  Medication Sig Dispense Refill   clorazepate  (TRANXENE ) 7.5 MG tablet TAKE 1 TABLET BY MOUTH TWICE A DAY AS NEEDED FOR ANXIETY/SLEEP 60 tablet 0   Empagliflozin -linaGLIPtin  (GLYXAMBI ) 25-5 MG TABS Take 1 tablet by mouth daily. 90 tablet 3   fluticasone  (FLONASE ) 50 MCG/ACT nasal spray Place 2 sprays into both nostrils daily. 16 g 1   glipiZIDE  (GLUCOTROL  XL) 2.5 MG 24 hr tablet TAKE 1 TABLET (2.5 MG TOTAL) BY MOUTH DAILY BEFORE BREAKFAST. 90 tablet 3   glucose blood (ONETOUCH VERIO) test strip Use as instructed 300 strip 1   hydrochlorothiazide  (HYDRODIURIL ) 25 MG tablet Take 1 tablet (25 mg total) by mouth daily. 90 tablet 1   olmesartan  (BENICAR ) 40 MG tablet TAKE 1 TABLET BY MOUTH EVERY DAY 90 tablet 3   ONETOUCH DELICA LANCETS FINE MISC 1 Device by Does not apply route daily as needed. 50 each 3   pantoprazole (PROTONIX) 40 MG tablet      rosuvastatin  (CRESTOR ) 5 MG tablet TAKE 1 TABLET BY MOUTH EVERY DAY 90 tablet 3   No current facility-administered medications on file prior to visit.   Allergies  Allergen Reactions   Cetirizine Hcl     REACTION: fatigue   Metformin  And Related Nausea And Vomiting   Penicillins Nausea And Vomiting   Valsartan      REACTION: rash   Family History  Problem Relation Age of Onset   Diabetes Mother    Stroke Mother    Cancer Father        colon or liver  Thyroid  disease and hypertension in mother  PE: BP 122/70   Pulse 88   Ht 5' 4 (1.626 m)   Wt 172 lb (78 kg)   SpO2 96%   BMI 29.52 kg/m    Wt Readings from Last 3 Encounters:  03/15/24 172 lb (78 kg)  01/11/24 173 lb (78.5 kg)  09/06/23 174 lb 12.8 oz (79.3 kg)   Constitutional: overweight, in NAD Eyes:  EOMI, no exophthalmos ENT: no neck masses, no  cervical lymphadenopathy Cardiovascular: RRR, No MRG Respiratory: CTA B Musculoskeletal: no deformities Skin:no rashes; she has a 1 in - painless mobile mass palpated in her right medial lumbar region  Neurological: no tremor with outstretched hands  ASSESSMENT: 1. DM2, non-insulin-dependent, uncontrolled, without long term complications, but with hyperglycemia  2. HL  3. Overweight  PLAN:  1. Patient with history of uncontrolled diabetes, on po  antidiabetic regimen with DPP 4 inhibitor, SGLT2 inhibitor and low-dose sulfonylurea.  We tried to use metformin  ER low-dose with dinner in the past but she had nausea with it.  We also tried Rybelsus  and Jardiance  separately, but she could not tolerate these.  She prefers a regimen that combines empagliflozin  with linagliptin  along with the glipizide  ER.  She was also not able to take glipizide  along with Glyxambi  in the morning as this made her feel off.  She is reticent usually to change her regimen.  Also, she is not able to check her blood sugars later in the day, only in the morning. - Before last office visit, she increased her exercise frequency from 2-3 times a week to 4-5 times a week and her sugars appear to be improved.  HbA1c was also better, at 7.5%, decreased from several 9%.  I did advise her to also check some blood sugar later in the day but did not change the regimen otherwise. - At today's visit, she is occasionally checking sugars in the morning and they are mostly normal, with few exceptions.  She is not checking later in the day.  HbA1c obtained today has improved slightly (see below).  For now, we can continue the current regimen. -She had a recent UTI, which has resolved.  Her back pain could be related to this.  Also, she has a 1 inch palpable mobile mass in the right lumbar region, possibly a lymph node.  She felt this for the first time at the time of her UTI.  She feels that this has decreased in size.  I advised her to check  it over the next 2 to 3 weeks and if not decreasing in size, she may need to let PCP know about this to see if further investigation is needed. - I suggested to:  Patient Instructions  Please continue: - Glyxambi  25-5 mg daily before dinner - Glipizide  ER 2.5 mg before b'fast  Check some sugars later in the day.  Please return in 4-6 months with your sugar log.  - we checked her HbA1c: 7.3% (lower) - advised to check sugars at different times of the day - 1x a day, rotating check times - advised for yearly eye exams >> she is UTD - return to clinic in 4-6 months  2. HL - Latest lipid panel was reviewed from 01/2023: LDL at goal, triglycerides slightly elevated: Lab Results  Component Value Date   CHOL 182 02/17/2023   HDL 51.70 02/17/2023   LDLCALC 93 02/17/2023   LDLDIRECT 122.0 04/01/2017   TRIG 184.0 (H) 02/17/2023   CHOLHDL 4 02/17/2023  - She takes Crestor  5 every other day.  She has mental fog and fatigue when she tries to take it daily.  3. Overweight - will continue the SGLT2 inhibitor which will also help with weight loss - she lost 5 lbs before the last 2 visits combined - She lost 1 pound since last visit.  Lela Fendt, MD PhD [facility] Endocrinology

## 2024-06-01 LAB — OPHTHALMOLOGY REPORT-SCANNED

## 2024-06-04 ENCOUNTER — Ambulatory Visit
Admission: RE | Admit: 2024-06-04 | Discharge: 2024-06-04 | Disposition: A | Source: Ambulatory Visit | Attending: Internal Medicine | Admitting: Internal Medicine

## 2024-06-04 ENCOUNTER — Other Ambulatory Visit: Payer: Self-pay | Admitting: Internal Medicine

## 2024-06-04 DIAGNOSIS — N6310 Unspecified lump in the right breast, unspecified quadrant: Secondary | ICD-10-CM

## 2024-06-14 ENCOUNTER — Ambulatory Visit: Admitting: Nurse Practitioner

## 2024-06-16 ENCOUNTER — Other Ambulatory Visit: Payer: Self-pay | Admitting: Internal Medicine

## 2024-06-16 DIAGNOSIS — E119 Type 2 diabetes mellitus without complications: Secondary | ICD-10-CM

## 2024-07-13 ENCOUNTER — Ambulatory Visit: Admitting: Internal Medicine

## 2024-07-17 ENCOUNTER — Ambulatory Visit: Admitting: Internal Medicine

## 2024-11-29 ENCOUNTER — Ambulatory Visit: Admitting: Nurse Practitioner
